# Patient Record
Sex: Female | Born: 1940 | Race: White | Hispanic: No | Marital: Married | Smoking: Former smoker
Health system: Southern US, Community
[De-identification: ages and names within clinical notes are randomized; demographics above are authoritative.]

## PROBLEM LIST (undated history)

## (undated) DIAGNOSIS — J449 Chronic obstructive pulmonary disease, unspecified: Secondary | ICD-10-CM

## (undated) DIAGNOSIS — T4145XA Adverse effect of unspecified anesthetic, initial encounter: Secondary | ICD-10-CM

## (undated) DIAGNOSIS — F419 Anxiety disorder, unspecified: Secondary | ICD-10-CM

## (undated) DIAGNOSIS — M1990 Unspecified osteoarthritis, unspecified site: Secondary | ICD-10-CM

## (undated) DIAGNOSIS — T8859XA Other complications of anesthesia, initial encounter: Secondary | ICD-10-CM

## (undated) HISTORY — PX: HERNIA REPAIR: SHX51

## (undated) HISTORY — PX: JOINT REPLACEMENT: SHX530

---

## 2004-02-26 ENCOUNTER — Ambulatory Visit: Payer: Self-pay | Admitting: Family Medicine

## 2005-03-01 ENCOUNTER — Ambulatory Visit: Payer: Self-pay | Admitting: Family Medicine

## 2006-03-08 ENCOUNTER — Ambulatory Visit: Payer: Self-pay | Admitting: Family Medicine

## 2006-03-22 ENCOUNTER — Ambulatory Visit: Payer: Self-pay | Admitting: Gastroenterology

## 2007-03-15 ENCOUNTER — Ambulatory Visit: Payer: Self-pay | Admitting: Family Medicine

## 2008-03-18 ENCOUNTER — Ambulatory Visit: Payer: Self-pay | Admitting: Family Medicine

## 2009-04-17 ENCOUNTER — Ambulatory Visit: Payer: Self-pay | Admitting: Family Medicine

## 2010-04-21 ENCOUNTER — Ambulatory Visit: Payer: Self-pay | Admitting: Family Medicine

## 2011-07-06 ENCOUNTER — Ambulatory Visit: Payer: Self-pay | Admitting: Family Medicine

## 2012-07-24 ENCOUNTER — Ambulatory Visit: Payer: Self-pay | Admitting: Internal Medicine

## 2012-07-25 ENCOUNTER — Ambulatory Visit: Payer: Self-pay | Admitting: Internal Medicine

## 2013-07-14 DIAGNOSIS — F419 Anxiety disorder, unspecified: Secondary | ICD-10-CM | POA: Insufficient documentation

## 2013-07-26 ENCOUNTER — Ambulatory Visit: Payer: Self-pay | Admitting: Internal Medicine

## 2014-02-13 ENCOUNTER — Observation Stay: Payer: Self-pay | Admitting: Surgery

## 2014-02-13 LAB — CBC WITH DIFFERENTIAL/PLATELET
BASOS ABS: 0 10*3/uL (ref 0.0–0.1)
Basophil %: 0.4 %
Eosinophil #: 0 10*3/uL (ref 0.0–0.7)
Eosinophil %: 0 %
HCT: 43.5 % (ref 35.0–47.0)
HGB: 14 g/dL (ref 12.0–16.0)
LYMPHS ABS: 1 10*3/uL (ref 1.0–3.6)
Lymphocyte %: 8.6 %
MCH: 29 pg (ref 26.0–34.0)
MCHC: 32.3 g/dL (ref 32.0–36.0)
MCV: 90 fL (ref 80–100)
MONOS PCT: 7.3 %
Monocyte #: 0.9 x10 3/mm (ref 0.2–0.9)
NEUTROS ABS: 9.7 10*3/uL — AB (ref 1.4–6.5)
NEUTROS PCT: 83.7 %
Platelet: 281 10*3/uL (ref 150–440)
RBC: 4.85 10*6/uL (ref 3.80–5.20)
RDW: 13.6 % (ref 11.5–14.5)
WBC: 11.6 10*3/uL — ABNORMAL HIGH (ref 3.6–11.0)

## 2014-02-13 LAB — CK TOTAL AND CKMB (NOT AT ARMC)
CK, Total: 87 U/L
CK-MB: 2.3 ng/mL (ref 0.5–3.6)

## 2014-02-13 LAB — COMPREHENSIVE METABOLIC PANEL
ALT: 29 U/L
Albumin: 4.3 g/dL (ref 3.4–5.0)
Alkaline Phosphatase: 72 U/L
Anion Gap: 6 — ABNORMAL LOW (ref 7–16)
BUN: 14 mg/dL (ref 7–18)
Bilirubin,Total: 0.9 mg/dL (ref 0.2–1.0)
CALCIUM: 9.7 mg/dL (ref 8.5–10.1)
CREATININE: 0.77 mg/dL (ref 0.60–1.30)
Chloride: 99 mmol/L (ref 98–107)
Co2: 31 mmol/L (ref 21–32)
Glucose: 116 mg/dL — ABNORMAL HIGH (ref 65–99)
OSMOLALITY: 273 (ref 275–301)
Potassium: 3.6 mmol/L (ref 3.5–5.1)
SGOT(AST): 22 U/L (ref 15–37)
SODIUM: 136 mmol/L (ref 136–145)
Total Protein: 7.9 g/dL (ref 6.4–8.2)

## 2014-02-13 LAB — LIPASE, BLOOD: Lipase: 114 U/L (ref 73–393)

## 2014-02-13 LAB — URINALYSIS, COMPLETE
BLOOD: NEGATIVE
Bacteria: NONE SEEN
Bilirubin,UR: NEGATIVE
Glucose,UR: NEGATIVE mg/dL (ref 0–75)
Leukocyte Esterase: NEGATIVE
NITRITE: NEGATIVE
Ph: 6 (ref 4.5–8.0)
Protein: NEGATIVE
RBC,UR: 5 /HPF (ref 0–5)
Specific Gravity: 1.018 (ref 1.003–1.030)
WBC UR: 1 /HPF (ref 0–5)

## 2014-02-13 LAB — TROPONIN I

## 2014-07-27 NOTE — H&P (Signed)
History of Present Illness 74 yof who had a RIH repair at age 74 or 74, who has noticed a groin buldge off and on for the last three months. Then yesterday she acutely developed lower abdominal pain, nausea, anorexia, and clear vomiting. No fever. Last meal yesterday. No sigificant pain in groin, but buldge doesn't resolve.   Past Med/Surgical Hx:  anxiety:   hernia repair:   arthritis:   ALLERGIES:  Amoxicillin: Insomnia  HOME MEDICATIONS: Medication Instructions Status  meloxicam 7.5 mg oral tablet 1 tab(s) orally once a day, As Needed Active  ALPRAZolam 0.25 mg oral tablet 1 tab(s) orally once a day, As Needed Active  acetaminophen 500 mg oral tablet 2 tab(s) orally every 6 hours, As Needed Active  multivitamin 1 tab(s) orally once a day Active  calcium carbonate 1 tab(s) orally once a day Active   Family and Social History:  Family History Non-Contributory   Social History positive tobacco (Greater than 1 year), negative ETOH, married, retired from [employer] and exercise [employer], smoked 1 1/2 PPD until 2008, no ETOH.   Place of Living Home   Review of Systems:  Fever/Chills No   Cough No   Sputum No   Abdominal Pain Yes   Diarrhea No   Constipation Yes   Nausea/Vomiting Yes   SOB/DOE No   Chest Pain No   Dysuria No   Tolerating PT Yes   Tolerating Diet No  Nauseated  Vomiting   Physical Exam:  GEN well developed, well nourished, no acute distress, thin   HEENT pink conjunctivae, PERRL, hearing intact to voice, moist oral mucosa, Oropharynx clear   NECK supple   RESP normal resp effort  clear BS  no use of accessory muscles   CARD regular rate  no murmur  No LE edema  no JVD  no Rub   ABD denies tenderness  soft   GU irreducible incarcerated RIH   EXTR negative cyanosis/clubbing, negative edema   SKIN normal to palpation, skin turgor poor   NEURO cranial nerves intact, negative tremor, follows commands, motor/sensory function intact   PSYCH alert,  A+O to time, place, person, good insight   Lab Results: Hepatic:  11-Nov-15 08:32   Bilirubin, Total 0.9  Alkaline Phosphatase 72 (46-116 NOTE: New Reference Range 10/23/13)  SGPT (ALT) 29 (14-63 NOTE: New Reference Range 10/23/13)  SGOT (AST) 22  Total Protein, Serum 7.9  Albumin, Serum 4.3  Routine Chem:  11-Nov-15 08:32   Glucose, Serum  116  BUN 14  Creatinine (comp) 0.77  Sodium, Serum 136  Potassium, Serum 3.6  Chloride, Serum 99  CO2, Serum 31  Calcium (Total), Serum 9.7  Osmolality (calc) 273  Anion Gap  6 (Result(s) reported on 13 Feb 2014 at 09:21AM.)  Lipase 114 (Result(s) reported on 13 Feb 2014 at 09:37AM.)  Cardiac:  11-Nov-15 08:32   Troponin I < 0.02 (0.00-0.05 0.05 ng/mL or less: NEGATIVE  Repeat testing in 3-6 hrs  if clinically indicated. >0.05 ng/mL: POTENTIAL  MYOCARDIAL INJURY. Repeat  testing in 3-6 hrs if  clinically indicated. NOTE: An increase or decrease  of 30% or more on serial  testing suggests a  clinically important change)  CK, Total 87 (26-192 NOTE: NEW REFERENCE RANGE  05/07/2013)  CPK-MB, Serum 2.3 (Result(s) reported on 13 Feb 2014 at 11:31AM.)  Routine UA:  11-Nov-15 08:32   Color (UA) Yellow  Clarity (UA) Clear  Glucose (UA) Negative  Bilirubin (UA) Negative  Ketones (UA) 1+  Specific Gravity (  UA) 1.018  Blood (UA) Negative  pH (UA) 6.0  Protein (UA) Negative  Nitrite (UA) Negative  Leukocyte Esterase (UA) Negative (Result(s) reported on 13 Feb 2014 at 09:24AM.)  RBC (UA) 5 /HPF  WBC (UA) 1 /HPF  Bacteria (UA) NONE SEEN  Epithelial Cells (UA) 2 /HPF (Result(s) reported on 13 Feb 2014 at 09:24AM.)  Routine Hem:  11-Nov-15 08:32   WBC (CBC)  11.6  RBC (CBC) 4.85  Hemoglobin (CBC) 14.0  Hematocrit (CBC) 43.5  Platelet Count (CBC) 281  MCV 90  MCH 29.0  MCHC 32.3  RDW 13.6  Neutrophil % 83.7  Lymphocyte % 8.6  Monocyte % 7.3  Eosinophil % 0.0  Basophil % 0.4  Neutrophil #  9.7  Lymphocyte # 1.0   Monocyte # 0.9  Eosinophil # 0.0  Basophil # 0.0 (Result(s) reported on 13 Feb 2014 at 09:21AM.)   Radiology Results: LabUnknown:    11-Nov-15 11:26, CT Abdomen and Pelvis With Contrast  PACS Image  CT:  CT Abdomen and Pelvis With Contrast  REASON FOR EXAM:    (1) vomiting, abd pain; (2) abd pain  COMMENTS:       PROCEDURE: CT  - CT ABDOMEN / PELVIS  W  - Feb 13 2014 11:26AM     CLINICAL DATA:  Abdominal pain. Vomiting. Hernia repair in  childhood. Bloating. Pain is primarily in the lower abdomen.    EXAM:  CT ABDOMEN AND PELVIS WITH CONTRAST    TECHNIQUE:  Multidetector CT imaging of the abdomen and pelvis was performed  using the standard protocol following bolus administration of  intravenous contrast.  CONTRAST:  100 cc Isovue 300    COMPARISON:  None.    FINDINGS:  Lower chest: Small hiatal hernia. Gastroesophageal reflux observed.    Hepatobiliary: Unremarkable    Pancreas: Borderline prominence the dorsal pancreatic duct in the  pancreatic body and head.    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    Adrenals/Urinary Tract: Unremarkable  Stomach/Bowel: Small hiatal hernia as noted above. Abnormal dilated  (3.2 cm in diameter) small bowel extending to a right inguinal  hernia at which there is atransition point from dilated afferent to  nondilated efferent bowel. Sigmoid colon diverticulosis noted  without active diverticulitis. Trace free pelvic fluid. Appendix  unremarkable.    Vascular/Lymphatic: Aortoiliac atherosclerotic vascular disease.    Reproductive: Mild prominence of left parametrial vascular  structures.    Other: No supplemental non-categorized findings.    Musculoskeletal: Right inguinal hernia causing small bowel  obstruction, with a knuckle of small bowel extending intothe right  inguinal hernia. The hernia extends lateral to the inferior  epigastric vessels and accordingly is compatible with an  indirect  inguinal hernia.    Extensive degenerative arthropathy of both hips with prominent  spurring, full-thickness loss of articular cartilage, and severe  subcortical cyst formation. Lumbar spondylosis and degenerative disc  disease with levoconvex lumbar scoliosis. Suspected right foraminal  impingement at L2-3 and left foraminal impingement at L5-S1. Grade 1  anterolisthesis at L3-4. Loss of disc height most striking at L2-3,  L4-5, and L5-S1.     IMPRESSION:  1. The dominant finding is a small bowel obstruction due to a right  indirect inguinal hernia. A knuckle of small bowel extends into the  right inguinal hernia which represents the transition point from  dilated to nondilated small bowel.  2. Small hiatal hernia with evidence of gastroesophageal reflux.  3. Markedly severe osteoarthritis of both hips.  4. Lower lumbar spondylosis and degenerative disc disease causing  impingement on the right at L2-3 and on the left at L5-S1.      Electronically Signed    By: Herbie Baltimore M.D.    On: 02/13/2014 11:40         Verified By: Dellia Cloud, M.D.,    Assessment/Admission Diagnosis Incarcerated recurrent RIH, with resultant SBO   Plan Redo RIH repair. Pt and husband understand risk of strangulation and potential need for enterectomy, and they agree.   Electronic Signatures: Claude Manges (MD)  (Signed 11-Nov-15 13:40)  Authored: CHIEF COMPLAINT and HISTORY, PAST MEDICAL/SURGIAL HISTORY, ALLERGIES, HOME MEDICATIONS, FAMILY AND SOCIAL HISTORY, REVIEW OF SYSTEMS, PHYSICAL EXAM, LABS, Radiology, ASSESSMENT AND PLAN   Last Updated: 11-Nov-15 13:40 by Claude Manges (MD)

## 2014-07-27 NOTE — Op Note (Signed)
PATIENT NAME:  Monica [name], Monica [name] MR#:  [id number] DATE OF BIRTH:  Sep 09, 1940  DATE OF PROCEDURE:  02/13/2014  PREOPERATIVE DIAGNOSIS: Incarcerated right inguinal hernia.   POSTOPERATIVE DIAGNOSIS: Incarcerated right femoral hernia.   PROCEDURE PERFORMED: Repair of right femoral hernia repair with preperitoneal Kugel patch.   SURGEON: Natale LayMark Mcclain Shall, MD FACS  ANESTHESIA: General endotracheal and local.   FINDINGS: The bowel reduced itself upon induction of general anesthesia. There was a femoral hernia with an approximately 1.5 cm neck.  SPECIMENS: Hernia sac to pathology.   ESTIMATED BLOOD LOSS: Minimal.   DESCRIPTION OF PROCEDURE: With informed consent, supine position, sterile prep and drape of the abdomen and right groin, timeout was observed.   An incision was fashioned in the right groin 1 fingerbreadth above the inguinal ligament with the scalpel and carried down through electrocautery through Scarpa fascia. There was no obvious external ring. The external oblique aponeurosis was then opened along its fibers with scissors. There was no obvious conjoined tendon. There was scar along the inguinal ligament, likely from previous hernia repair as a child. The preperitoneal space was opened by opening the floor of the inguinal canal with electrocautery. The pre-peritoneal space was thus opened and cooper's ligament exposed.  The femoral hernia sac was identified. In order for it to be reduced, the fibers medial to the hernia defect were incised with electrocautery for approximately 5 mm. The hernia sac was able to be dissected back out of the canal. It was opened. No small bowel was encountered in the sac. I brought up the loop of small bowel which appeared viable that had been in the hernia sac.  The hernia sac was ligated and submitted as specimen. This was accomplished with a running locking 000 silk suture.  A small oval Kugel patch was brought onto the field, secured to the shelving edge of the  Cooper ligament at 2 spots with #0 Vicryl suture and then unfurled onto the preperitoneal space covering all 3 potential hernia defects. Laterally and superiorly, it was sutured to the conjoined tendon. The transversalis fascia was then reapproximated with a running #0 Vicryl suture. The external oblique aponeurosis was then reapproximated in a similar fashion. 2-0 Vicryl and Scarpa fascia was accomplished. Skin staples were applied. The field block was created with a total of 30 mL of 0.25% plain Marcaine. Sterile dressing was applied, and the patient was subsequently taken to the recovery room, extubated in stable and satisfactory condition by anesthesia services.     ____________________________ Redge GainerMark A. Egbert GaribaldiBird, MD FACS mab:TT D: 02/13/2014 22:28:18 ET T: 02/13/2014 22:43:04 ET JOB#: [id number]  cc: Loraine LericheMark A. Egbert GaribaldiBird, MD, <Dictator> Raynald KempMARK A Jasmane Brockway MD ELECTRONICALLY SIGNED 02/13/2014 23:31

## 2014-07-29 LAB — SURGICAL PATHOLOGY

## 2014-08-19 ENCOUNTER — Other Ambulatory Visit: Payer: Self-pay | Admitting: Internal Medicine

## 2014-08-19 DIAGNOSIS — Z1231 Encounter for screening mammogram for malignant neoplasm of breast: Secondary | ICD-10-CM

## 2014-08-29 ENCOUNTER — Ambulatory Visit: Payer: Self-pay

## 2014-09-25 ENCOUNTER — Encounter
Admission: RE | Admit: 2014-09-25 | Discharge: 2014-09-25 | Disposition: A | Discharge status: Home / Self Care | Payer: Medicare Other | Source: Ambulatory Visit | Attending: Orthopedic Surgery | Admitting: Orthopedic Surgery

## 2014-09-25 DIAGNOSIS — J449 Chronic obstructive pulmonary disease, unspecified: Secondary | ICD-10-CM | POA: Insufficient documentation

## 2014-09-25 DIAGNOSIS — Z884 Allergy status to anesthetic agent status: Secondary | ICD-10-CM | POA: Diagnosis not present

## 2014-09-25 DIAGNOSIS — Z791 Long term (current) use of non-steroidal anti-inflammatories (NSAID): Secondary | ICD-10-CM | POA: Diagnosis not present

## 2014-09-25 DIAGNOSIS — Z01812 Encounter for preprocedural laboratory examination: Secondary | ICD-10-CM | POA: Diagnosis present

## 2014-09-25 DIAGNOSIS — Z01810 Encounter for preprocedural cardiovascular examination: Secondary | ICD-10-CM | POA: Diagnosis present

## 2014-09-25 DIAGNOSIS — M1990 Unspecified osteoarthritis, unspecified site: Secondary | ICD-10-CM | POA: Insufficient documentation

## 2014-09-25 DIAGNOSIS — Z5181 Encounter for therapeutic drug level monitoring: Secondary | ICD-10-CM | POA: Diagnosis not present

## 2014-09-25 DIAGNOSIS — Z881 Allergy status to other antibiotic agents status: Secondary | ICD-10-CM | POA: Diagnosis not present

## 2014-09-25 DIAGNOSIS — F419 Anxiety disorder, unspecified: Secondary | ICD-10-CM | POA: Diagnosis not present

## 2014-09-25 DIAGNOSIS — Z79899 Other long term (current) drug therapy: Secondary | ICD-10-CM | POA: Diagnosis not present

## 2014-09-25 DIAGNOSIS — K219 Gastro-esophageal reflux disease without esophagitis: Secondary | ICD-10-CM | POA: Diagnosis not present

## 2014-09-25 HISTORY — DX: Other complications of anesthesia, initial encounter: T88.59XA

## 2014-09-25 HISTORY — DX: Adverse effect of unspecified anesthetic, initial encounter: T41.45XA

## 2014-09-25 HISTORY — DX: Anxiety disorder, unspecified: F41.9

## 2014-09-25 HISTORY — DX: Unspecified osteoarthritis, unspecified site: M19.90

## 2014-09-25 LAB — BASIC METABOLIC PANEL
Anion gap: 13 (ref 5–15)
BUN: 10 mg/dL (ref 6–20)
CALCIUM: 9.9 mg/dL (ref 8.9–10.3)
CO2: 28 mmol/L (ref 22–32)
CREATININE: 0.49 mg/dL (ref 0.44–1.00)
Chloride: 103 mmol/L (ref 101–111)
GFR calc Af Amer: >60 mL/min (ref >60)
GFR calc non Af Amer: >60 mL/min (ref >60)
Glucose, Bld: 102 mg/dL — ABNORMAL HIGH (ref 65–99)
Potassium: 3.8 mmol/L (ref 3.5–5.1)
SODIUM: 144 mmol/L (ref 135–145)

## 2014-09-25 LAB — URINALYSIS COMPLETE WITH MICROSCOPIC (ARMC ONLY)
Bilirubin Urine: NEGATIVE
Glucose, UA: NEGATIVE mg/dL
Hgb urine dipstick: NEGATIVE
KETONES UR: NEGATIVE mg/dL
Leukocytes, UA: NEGATIVE
NITRITE: NEGATIVE
Protein, ur: NEGATIVE mg/dL
Specific Gravity, Urine: 1.008 (ref 1.005–1.030)
pH: 7 (ref 5.0–8.0)

## 2014-09-25 LAB — PROTIME-INR
INR: 0.96
Prothrombin Time: 13 seconds (ref 11.4–15.0)

## 2014-09-25 LAB — CBC
HCT: 40 % (ref 35.0–47.0)
Hemoglobin: 13.1 g/dL (ref 12.0–16.0)
MCH: 28.8 pg (ref 26.0–34.0)
MCHC: 32.8 g/dL (ref 32.0–36.0)
MCV: 87.6 fL (ref 80.0–100.0)
PLATELETS: 215 10*3/uL (ref 150–440)
RBC: 4.57 MIL/uL (ref 3.80–5.20)
RDW: 13.4 % (ref 11.5–14.5)
WBC: 8.5 10*3/uL (ref 3.6–11.0)

## 2014-09-25 LAB — TYPE AND SCREEN
ABO/RH(D): O NEG
Antibody Screen: NEGATIVE

## 2014-09-25 LAB — SURGICAL PCR SCREEN
MRSA, PCR: NEGATIVE
Staphylococcus aureus: NEGATIVE

## 2014-09-25 LAB — APTT: APTT: 27 s (ref 24–36)

## 2014-09-25 LAB — ABO/RH: ABO/RH(D): O NEG

## 2014-09-25 LAB — SEDIMENTATION RATE: Sed Rate: 5 mm/hr (ref 0–30)

## 2014-09-25 NOTE — Patient Instructions (Signed)
  Your procedure is scheduled on: Wednesday 7/6 Report to Day Surgery.  MEDICAL MALL ENTRANCE To find out your arrival time please call [phone] between 1PM - 3PM on Tuesday 7/5.  Remember: Instructions that are not followed completely may result in serious medical risk, up to and including death, or upon the discretion of your surgeon and anesthesiologist your surgery may need to be rescheduled.    __X__ 1. Do not eat food or drink liquids after midnight. No gum chewing or hard candies.     __X__ 2. No Alcohol for 24 hours before or after surgery.   ____ 3. Bring all medications with you on the day of surgery if instructed.    __X__ 4. Notify your doctor if there is any change in your medical condition     (cold, fever, infections).     Do not wear jewelry, make-up, hairpins, clips or nail polish.  Do not wear lotions, powders, or perfumes.  Do not shave 48 hours prior to surgery. Men may shave face and neck.  Do not bring valuables to the hospital.    [facility] is not responsible for any belongings or valuables.               Contacts, dentures or bridgework may not be worn into surgery.  Leave your suitcase in the car. After surgery it may be brought to your room.  For patients admitted to the hospital, discharge time is determined by your                treatment team.   Patients discharged the day of surgery will not be allowed to drive home.   Please read over the following fact sheets that you were given:   MRSA Information and Surgical Site Infection Prevention   __X__ Take these medicines the morning of surgery with A SIP OF WATER:    1. ALPRAZOLAM IF NEEDED  2.   3.   4.  5.  6.  ____ Fleet Enema (as directed)   __X__ Use CHG Soap as directed  ____ Use inhalers on the day of surgery  ____ Stop metformin 2 days prior to surgery    ____ Take 1/2 of usual insulin dose the night before surgery and none on the morning of surgery.   ____ Stop  Coumadin/Plavix/aspirin on   ____ Stop Anti-inflammatories on    ____ Stop supplements until after surgery.    ____ Bring C-Pap to the hospital.

## 2014-09-26 LAB — URINE CULTURE

## 2014-10-09 ENCOUNTER — Encounter
Admission: RE | Disposition: A | Discharge status: Home Health | Payer: Self-pay | Source: Ambulatory Visit | Attending: Orthopedic Surgery

## 2014-10-09 ENCOUNTER — Encounter: Payer: Self-pay | Admitting: *Deleted

## 2014-10-09 ENCOUNTER — Inpatient Hospital Stay: Payer: Medicare Other | Admitting: Anesthesiology

## 2014-10-09 ENCOUNTER — Inpatient Hospital Stay
Admission: RE | Admit: 2014-10-09 | Discharge: 2014-10-12 | DRG: 470 | Disposition: A | Discharge status: Home Health | Payer: Medicare Other | Source: Ambulatory Visit | Attending: Orthopedic Surgery | Admitting: Orthopedic Surgery

## 2014-10-09 ENCOUNTER — Inpatient Hospital Stay: Payer: Medicare Other

## 2014-10-09 DIAGNOSIS — Z681 Body mass index (BMI) 19 or less, adult: Secondary | ICD-10-CM | POA: Diagnosis not present

## 2014-10-09 DIAGNOSIS — K219 Gastro-esophageal reflux disease without esophagitis: Secondary | ICD-10-CM | POA: Diagnosis present

## 2014-10-09 DIAGNOSIS — M1611 Unilateral primary osteoarthritis, right hip: Secondary | ICD-10-CM | POA: Diagnosis present

## 2014-10-09 DIAGNOSIS — M8568 Other cyst of bone, other site: Secondary | ICD-10-CM | POA: Diagnosis present

## 2014-10-09 DIAGNOSIS — M169 Osteoarthritis of hip, unspecified: Secondary | ICD-10-CM | POA: Diagnosis present

## 2014-10-09 DIAGNOSIS — F419 Anxiety disorder, unspecified: Secondary | ICD-10-CM | POA: Diagnosis present

## 2014-10-09 DIAGNOSIS — Z96641 Presence of right artificial hip joint: Secondary | ICD-10-CM

## 2014-10-09 DIAGNOSIS — Z96649 Presence of unspecified artificial hip joint: Secondary | ICD-10-CM

## 2014-10-09 HISTORY — PX: TOTAL HIP ARTHROPLASTY: SHX124

## 2014-10-09 LAB — TYPE AND SCREEN
ABO/RH(D): O NEG
Antibody Screen: NEGATIVE

## 2014-10-09 SURGERY — ARTHROPLASTY, HIP, TOTAL,POSTERIOR APPROACH
Anesthesia: Spinal | Laterality: Right | Wound class: Clean

## 2014-10-09 MED ORDER — ACETAMINOPHEN 10 MG/ML IV SOLN
INTRAVENOUS | Status: AC
  Filled 2014-10-09: qty 100

## 2014-10-09 MED ORDER — FAMOTIDINE 20 MG PO TABS
20.0000 mg | ORAL_TABLET | Freq: Once | ORAL | Status: AC
  Administered 2014-10-09: 20 mg via ORAL

## 2014-10-09 MED ORDER — ACETAMINOPHEN 10 MG/ML IV SOLN
1000.0000 mg | Freq: Four times a day (QID) | INTRAVENOUS | Status: AC
  Administered 2014-10-09 – 2014-10-10 (×4): 1000 mg via INTRAVENOUS
  Filled 2014-10-09 (×4): qty 100

## 2014-10-09 MED ORDER — TETRACAINE HCL 1 % IJ SOLN
INTRAMUSCULAR | Status: DC | PRN
  Administered 2014-10-09: 5 mg via INTRASPINAL

## 2014-10-09 MED ORDER — METOCLOPRAMIDE HCL 10 MG PO TABS
10.0000 mg | ORAL_TABLET | Freq: Three times a day (TID) | ORAL | Status: AC
  Administered 2014-10-09 – 2014-10-11 (×7): 10 mg via ORAL
  Filled 2014-10-09 (×7): qty 1

## 2014-10-09 MED ORDER — FLUMAZENIL 0.5 MG/5ML IV SOLN
INTRAVENOUS | Status: AC
Start: 2014-10-09 — End: 2014-10-09
  Filled 2014-10-09: qty 5

## 2014-10-09 MED ORDER — CLINDAMYCIN PHOSPHATE 900 MG/50ML IV SOLN
900.0000 mg | Freq: Once | INTRAVENOUS | Status: AC
  Administered 2014-10-09: 900 mg via INTRAVENOUS

## 2014-10-09 MED ORDER — ALPRAZOLAM 0.5 MG PO TABS
0.2500 mg | ORAL_TABLET | Freq: Every day | ORAL | Status: DC | PRN
  Administered 2014-10-09 – 2014-10-11 (×3): 0.25 mg via ORAL
  Filled 2014-10-09 (×3): qty 1

## 2014-10-09 MED ORDER — CALCIUM CARBONATE-VITAMIN D 500-200 MG-UNIT PO TABS
2.0000 | ORAL_TABLET | Freq: Every day | ORAL | Status: DC
  Administered 2014-10-10 – 2014-10-12 (×3): 2 via ORAL
  Filled 2014-10-09 (×3): qty 2

## 2014-10-09 MED ORDER — FENTANYL CITRATE (PF) 100 MCG/2ML IJ SOLN
INTRAMUSCULAR | Status: DC | PRN
  Administered 2014-10-09: 50 ug via INTRAVENOUS

## 2014-10-09 MED ORDER — ONDANSETRON HCL 4 MG/2ML IJ SOLN: 4.0000 mg | Freq: Four times a day (QID) | INTRAMUSCULAR | Status: DC | PRN

## 2014-10-09 MED ORDER — PHENYLEPHRINE HCL 10 MG/ML IJ SOLN
INTRAMUSCULAR | Status: DC | PRN
  Administered 2014-10-09 (×2): 100 ug via INTRAVENOUS
  Administered 2014-10-09 (×2): 50 ug via INTRAVENOUS

## 2014-10-09 MED ORDER — DIPHENHYDRAMINE HCL 12.5 MG/5ML PO ELIX: 12.5000 mg | ORAL_SOLUTION | ORAL | Status: DC | PRN

## 2014-10-09 MED ORDER — FERROUS SULFATE 325 (65 FE) MG PO TABS
325.0000 mg | ORAL_TABLET | Freq: Two times a day (BID) | ORAL | Status: DC
  Administered 2014-10-09 – 2014-10-12 (×6): 325 mg via ORAL
  Filled 2014-10-09 (×6): qty 1

## 2014-10-09 MED ORDER — MORPHINE SULFATE 2 MG/ML IJ SOLN
2.0000 mg | INTRAMUSCULAR | Status: DC | PRN
  Administered 2014-10-09 – 2014-10-10 (×3): 2 mg via INTRAVENOUS
  Filled 2014-10-09 (×3): qty 1

## 2014-10-09 MED ORDER — ENOXAPARIN SODIUM 30 MG/0.3ML ~~LOC~~ SOLN
30.0000 mg | Freq: Two times a day (BID) | SUBCUTANEOUS | Status: DC
  Administered 2014-10-10 – 2014-10-12 (×5): 30 mg via SUBCUTANEOUS
  Filled 2014-10-09 (×5): qty 0.3

## 2014-10-09 MED ORDER — TRANEXAMIC ACID 1000 MG/10ML IV SOLN
1000.0000 mg | INTRAVENOUS | Status: DC
  Filled 2014-10-09: qty 10

## 2014-10-09 MED ORDER — ALUM & MAG HYDROXIDE-SIMETH 200-200-20 MG/5ML PO SUSP: 30.0000 mL | ORAL | Status: DC | PRN

## 2014-10-09 MED ORDER — BISACODYL 10 MG RE SUPP: 10.0000 mg | Freq: Every day | RECTAL | Status: DC | PRN

## 2014-10-09 MED ORDER — ACETAMINOPHEN 650 MG RE SUPP: 650.0000 mg | Freq: Four times a day (QID) | RECTAL | Status: DC | PRN

## 2014-10-09 MED ORDER — GLYCOPYRROLATE 0.2 MG/ML IJ SOLN
INTRAMUSCULAR | Status: DC | PRN
  Administered 2014-10-09: 0.2 mg via INTRAVENOUS

## 2014-10-09 MED ORDER — PROPOFOL INFUSION 10 MG/ML OPTIME
INTRAVENOUS | Status: DC | PRN
  Administered 2014-10-09: 40 ug/kg/min via INTRAVENOUS

## 2014-10-09 MED ORDER — TETRACAINE HCL 1 % IJ SOLN
INTRAMUSCULAR | Status: AC
  Filled 2014-10-09: qty 2

## 2014-10-09 MED ORDER — BUPIVACAINE HCL (PF) 0.5 % IJ SOLN
INTRAMUSCULAR | Status: DC | PRN
  Administered 2014-10-09: 2.5 mL

## 2014-10-09 MED ORDER — TRANEXAMIC ACID 1000 MG/10ML IV SOLN
1000.0000 mg | Freq: Once | INTRAVENOUS | Status: AC
  Administered 2014-10-09: 1000 mg via INTRAVENOUS
  Filled 2014-10-09: qty 10

## 2014-10-09 MED ORDER — ADULT MULTIVITAMIN W/MINERALS CH
1.0000 | ORAL_TABLET | Freq: Every day | ORAL | Status: DC
  Administered 2014-10-10 – 2014-10-12 (×3): 1 via ORAL
  Filled 2014-10-09 (×3): qty 1

## 2014-10-09 MED ORDER — FLEET ENEMA 7-19 GM/118ML RE ENEM: 1.0000 | ENEMA | Freq: Once | RECTAL | Status: AC | PRN

## 2014-10-09 MED ORDER — FENTANYL CITRATE (PF) 100 MCG/2ML IJ SOLN: 25.0000 ug | INTRAMUSCULAR | Status: DC | PRN

## 2014-10-09 MED ORDER — SODIUM CHLORIDE 0.9 % IV SOLN
1000.0000 mg | INTRAVENOUS | Status: DC | PRN
  Administered 2014-10-09: 1000 mg via INTRAVENOUS

## 2014-10-09 MED ORDER — NEOMYCIN-POLYMYXIN B GU 40-200000 IR SOLN
Status: DC | PRN
  Administered 2014-10-09: 12 mL

## 2014-10-09 MED ORDER — ONDANSETRON HCL 4 MG/2ML IJ SOLN: 4.0000 mg | Freq: Once | INTRAMUSCULAR | Status: DC | PRN

## 2014-10-09 MED ORDER — SODIUM CHLORIDE 0.9 % IV SOLN
INTRAVENOUS | Status: DC
  Administered 2014-10-09: 13:00:00 via INTRAVENOUS

## 2014-10-09 MED ORDER — CLINDAMYCIN PHOSPHATE 600 MG/50ML IV SOLN
600.0000 mg | Freq: Four times a day (QID) | INTRAVENOUS | Status: AC
  Administered 2014-10-09 – 2014-10-10 (×4): 600 mg via INTRAVENOUS
  Filled 2014-10-09 (×4): qty 50

## 2014-10-09 MED ORDER — MENTHOL 3 MG MT LOZG: 1.0000 | LOZENGE | OROMUCOSAL | Status: DC | PRN

## 2014-10-09 MED ORDER — NEOMYCIN-POLYMYXIN B GU 40-200000 IR SOLN
Status: AC
  Filled 2014-10-09: qty 20

## 2014-10-09 MED ORDER — TRAMADOL HCL 50 MG PO TABS
50.0000 mg | ORAL_TABLET | ORAL | Status: DC | PRN
  Administered 2014-10-12: 100 mg via ORAL
  Filled 2014-10-09: qty 2

## 2014-10-09 MED ORDER — PHENYLEPHRINE HCL 10 MG/ML IJ SOLN
10000.0000 ug | INTRAMUSCULAR | Status: DC | PRN
  Administered 2014-10-09: 10 ug/min via INTRAVENOUS

## 2014-10-09 MED ORDER — PROPOFOL 10 MG/ML IV BOLUS
INTRAVENOUS | Status: DC | PRN
  Administered 2014-10-09: 40 mg via INTRAVENOUS

## 2014-10-09 MED ORDER — MAGNESIUM HYDROXIDE 400 MG/5ML PO SUSP
30.0000 mL | Freq: Every day | ORAL | Status: DC | PRN
  Administered 2014-10-11: 30 mL via ORAL
  Filled 2014-10-09: qty 30

## 2014-10-09 MED ORDER — SENNOSIDES-DOCUSATE SODIUM 8.6-50 MG PO TABS
1.0000 | ORAL_TABLET | Freq: Two times a day (BID) | ORAL | Status: DC
  Administered 2014-10-09 – 2014-10-12 (×6): 1 via ORAL
  Filled 2014-10-09 (×6): qty 1

## 2014-10-09 MED ORDER — ACETAMINOPHEN 325 MG PO TABS: 650.0000 mg | ORAL_TABLET | Freq: Four times a day (QID) | ORAL | Status: DC | PRN

## 2014-10-09 MED ORDER — MIDAZOLAM HCL 5 MG/5ML IJ SOLN
INTRAMUSCULAR | Status: DC | PRN
  Administered 2014-10-09: 1 mg via INTRAVENOUS

## 2014-10-09 MED ORDER — ONDANSETRON HCL 4 MG PO TABS: 4.0000 mg | ORAL_TABLET | Freq: Four times a day (QID) | ORAL | Status: DC | PRN

## 2014-10-09 MED ORDER — LACTATED RINGERS IV SOLN
INTRAVENOUS | Status: DC
  Administered 2014-10-09 (×2): via INTRAVENOUS

## 2014-10-09 MED ORDER — PHENOL 1.4 % MT LIQD: 1.0000 | OROMUCOSAL | Status: DC | PRN

## 2014-10-09 MED ORDER — OXYCODONE HCL 5 MG PO TABS
5.0000 mg | ORAL_TABLET | ORAL | Status: DC | PRN
  Administered 2014-10-09 (×2): 5 mg via ORAL
  Administered 2014-10-10 (×5): 10 mg via ORAL
  Administered 2014-10-11 (×2): 5 mg via ORAL
  Administered 2014-10-11: 10 mg via ORAL
  Administered 2014-10-11: 5 mg via ORAL
  Administered 2014-10-11: 10 mg via ORAL
  Filled 2014-10-09 (×2): qty 2
  Filled 2014-10-09: qty 1
  Filled 2014-10-09: qty 2
  Filled 2014-10-09 (×3): qty 1
  Filled 2014-10-09: qty 2
  Filled 2014-10-09: qty 1
  Filled 2014-10-09 (×2): qty 2
  Filled 2014-10-09: qty 1
  Filled 2014-10-09: qty 2

## 2014-10-09 MED ORDER — ACETAMINOPHEN 10 MG/ML IV SOLN
INTRAVENOUS | Status: DC | PRN
  Administered 2014-10-09: 1000 mg via INTRAVENOUS

## 2014-10-09 MED ORDER — PANTOPRAZOLE SODIUM 40 MG PO TBEC
40.0000 mg | DELAYED_RELEASE_TABLET | Freq: Two times a day (BID) | ORAL | Status: DC
  Administered 2014-10-09 – 2014-10-12 (×6): 40 mg via ORAL
  Filled 2014-10-09 (×6): qty 1

## 2014-10-09 SURGICAL SUPPLY — 53 items
BLADE DRUM FLTD (BLADE) ×3 IMPLANT
BLADE SAW 1 (BLADE) ×3 IMPLANT
CANISTER SUCT 1200ML W/VALVE (MISCELLANEOUS) ×3 IMPLANT
CANISTER SUCT 3000ML (MISCELLANEOUS) ×6 IMPLANT
CAPT HIP TOTAL 2 ×3 IMPLANT
CARTRIDGE OIL MAESTRO DRILL (MISCELLANEOUS) ×1 IMPLANT
CATH FOL LEG HOLDER (MISCELLANEOUS) ×3 IMPLANT
CATH TRAY 16F METER LATEX (MISCELLANEOUS) ×3 IMPLANT
CUP ACET PINNACLE SECTR 60MM (Hips) ×1 IMPLANT
DIFFUSER MAESTRO (MISCELLANEOUS) ×3 IMPLANT
DRAPE INCISE IOBAN 66X60 STRL (DRAPES) ×3 IMPLANT
DRAPE SHEET LG 3/4 BI-LAMINATE (DRAPES) ×3 IMPLANT
DRAPE TABLE BACK 80X90 (DRAPES) ×3 IMPLANT
DRSG DERMACEA 8X12 NADH (GAUZE/BANDAGES/DRESSINGS) ×3 IMPLANT
DRSG OPSITE POSTOP 4X12 (GAUZE/BANDAGES/DRESSINGS) IMPLANT
DRSG OPSITE POSTOP 4X14 (GAUZE/BANDAGES/DRESSINGS) ×3 IMPLANT
DRSG TEGADERM 4X4.75 (GAUZE/BANDAGES/DRESSINGS) ×3 IMPLANT
DURAPREP 26ML APPLICATOR (WOUND CARE) ×3 IMPLANT
ELECT BLADE 6.5 EXT (BLADE) ×3 IMPLANT
ELECT CAUTERY BLADE 6.4 (BLADE) ×3 IMPLANT
GLOVE BIO SURGEON STRL SZ7 (GLOVE) ×3 IMPLANT
GLOVE BIOGEL M STRL SZ7.5 (GLOVE) ×6 IMPLANT
GLOVE INDICATOR 8.0 STRL GRN (GLOVE) ×3 IMPLANT
GLOVE SURG 9.0 ORTHO LTXF (GLOVE) ×3 IMPLANT
GLOVE SURG ORTHO 9.0 STRL STRW (GLOVE) ×3 IMPLANT
GOWN STRL REUS W/ TWL LRG LVL3 (GOWN DISPOSABLE) ×1 IMPLANT
GOWN STRL REUS W/TWL 2XL LVL3 (GOWN DISPOSABLE) ×3 IMPLANT
GOWN STRL REUS W/TWL LRG LVL3 (GOWN DISPOSABLE) ×2
GOWN STRL REUS W/TWL XL LVL4 (GOWN DISPOSABLE) ×3 IMPLANT
HANDPIECE SUCTION TUBG SURGILV (MISCELLANEOUS) ×3 IMPLANT
HEMOVAC 400CC 10FR (MISCELLANEOUS) ×3 IMPLANT
HOOD PEEL AWAY FACE SHEILD DIS (HOOD) ×6 IMPLANT
KIT RM TURNOVER STRD PROC AR (KITS) ×3 IMPLANT
NDL SAFETY 18GX1.5 (NEEDLE) ×3 IMPLANT
NS IRRIG 1000ML POUR BTL (IV SOLUTION) ×3 IMPLANT
OIL CARTRIDGE MAESTRO DRILL (MISCELLANEOUS) ×3
PACK HIP PROSTHESIS (MISCELLANEOUS) ×3 IMPLANT
PINNSECTOR W/GRIP ACE CUP 60MM (Hips) ×3 IMPLANT
SOL .9 NS 3000ML IRR  AL (IV SOLUTION) ×2
SOL .9 NS 3000ML IRR UROMATIC (IV SOLUTION) ×1 IMPLANT
SOL PREP PVP 2OZ (MISCELLANEOUS) ×3
SOLUTION PREP PVP 2OZ (MISCELLANEOUS) ×1 IMPLANT
SPONGE DRAIN TRACH 4X4 STRL 2S (GAUZE/BANDAGES/DRESSINGS) ×9 IMPLANT
STAPLER SKIN PROX 35W (STAPLE) ×3 IMPLANT
SUT ETHIBOND #5 BRAIDED 30INL (SUTURE) ×3 IMPLANT
SUT VIC AB 0 CT1 36 (SUTURE) ×3 IMPLANT
SUT VIC AB 1 CT1 36 (SUTURE) ×6 IMPLANT
SUT VIC AB 2-0 CT1 27 (SUTURE) ×2
SUT VIC AB 2-0 CT1 TAPERPNT 27 (SUTURE) ×1 IMPLANT
SYR 20CC LL (SYRINGE) ×3 IMPLANT
TAPE ADH 3 LX (MISCELLANEOUS) ×3 IMPLANT
TAPE TRANSPORE STRL 2 31045 (GAUZE/BANDAGES/DRESSINGS) ×3 IMPLANT
WATER STERILE IRR 1000ML POUR (IV SOLUTION) ×6 IMPLANT

## 2014-10-09 NOTE — Anesthesia Preprocedure Evaluation (Signed)
Anesthesia Evaluation  Patient identified by MRN, date of birth, ID bandGeneral Assessment Comment:Woke up agitated during hernia surgery  Reviewed: Allergy & Precautions, NPO status , Patient's Chart, lab work & pertinent test results  History of Anesthesia Complications (+) history of anesthetic complications  Airway Mallampati: II  TM Distance: >3 FB Neck ROM: Limited    Dental no notable dental hx.    Pulmonary former smoker,  breath sounds clear to auscultation  Pulmonary exam normal       Cardiovascular + Orthopnea negative cardio ROS Normal cardiovascular exam    Neuro/Psych Anxiety negative neurological ROS     GI/Hepatic Neg liver ROS, GERD-  Medicated and Controlled,  Endo/Other  negative endocrine ROS  Renal/GU negative Renal ROS  negative genitourinary   Musculoskeletal  (+) Arthritis -, Osteoarthritis,    Abdominal Normal abdominal exam  (+)   Peds negative pediatric ROS (+)  Hematology negative hematology ROS (+)   Anesthesia Other Findings   Reproductive/Obstetrics                             Anesthesia Physical Anesthesia Plan  ASA: II  Anesthesia Plan: Spinal   Post-op Pain Management:    Induction: Intravenous  Airway Management Planned: Nasal Cannula  Additional Equipment:   Intra-op Plan:   Post-operative Plan:   Informed Consent: I have reviewed the patients History and Physical, chart, labs and discussed the procedure including the risks, benefits and alternatives for the proposed anesthesia with the patient or authorized representative who has indicated his/her understanding and acceptance.   Dental advisory given  Plan Discussed with: CRNA and Surgeon  Anesthesia Plan Comments:         Anesthesia Quick Evaluation

## 2014-10-09 NOTE — Progress Notes (Signed)
Patient came to floor with temperature that was not within normal parameters.  Bear Hugger remained until patient resumed normal temperature.  Patient still does not have feeling in legs or feet.  Patient very anxious about the feeling in her legs.  Reassured patient that sensation will begin to come back and patient instructed to advise when she starts to feel discomfort.

## 2014-10-09 NOTE — Op Note (Signed)
DATE OF SURGERY:  10/09/2014  PATIENT NAME:  Monica [name]   DOB: December 17, 1940  MRN: [id number]  PRE-OPERATIVE DIAGNOSIS: Degenerative arthrosis of the right hip, primary  POST-OPERATIVE DIAGNOSIS:  Same  PROCEDURE:  Right total hip arthroplasty, autologous bone grafting of acetabular cysts  SURGEON:  Jena Gauss. M.D.  ASSISTANT:  Van Clines, PA (present and scrubbed throughout the case, critical for assistance with exposure, retraction, instrumentation, and closure)  ANESTHESIA: spinal  ESTIMATED BLOOD LOSS: 300 mL  FLUIDS REPLACED: 1200 mL of crystalloid  DRAINS: 2 medium drains to a Hemovac reservoir  IMPLANTS UTILIZED: DePuy 13.5 mm small stature AML femoral stem, 60 mm OD Pinnacle Gription Sector acetabular component, neutral Pinnacle Marathon polyethylene insert, and a 36 mm M-SPEC +1.5 mm hip ball, 2 - 6.45mm cancellous screws  INDICATIONS FOR SURGERY: Monica [name] is a 74 y.o. year old female with a long history of progressive hip and groin  pain. X-rays demonstrated severe degenerative changes. The patient had not seen any significant improvement despite conservative nonsurgical intervention. After discussion of the risks and benefits of surgical intervention, the patient expressed understanding of the risks benefits and agree with plans for total hip arthroplasty.   The risks, benefits, and alternatives were discussed at length including but not limited to the risks of infection, bleeding, nerve injury, stiffness, blood clots, the need for revision surgery, limb length inequality, dislocation, cardiopulmonary complications, among others, and they were willing to proceed.  PROCEDURE IN DETAIL: The patient was brought into the operating room and, after adequate spinal anesthesia was achieved, the patient was placed in a left lateral decubitus position. Axillary roll was placed and all bony prominences were well-padded. The patient's right hip was cleaned and prepped with alcohol  and DuraPrep and draped in the usual sterile fashion. A "timeout" was performed as per usual protocol. A lateral curvilinear incision was made gently curving towards the posterior superior iliac spine. The IT band was incised in line with the skin incision and the fibers of the gluteus maximus were split in line. The piriformis tendon was identified, skeletonized, and incised at its insertion to the proximal femur and reflected posteriorly. A T type posterior capsulotomy was performed. Prior to dislocation of the femoral head, a threaded Steinmann pin was inserted through a separate stab incision into the pelvis superior to the acetabulum and bent in the form of a stylus so as to assess limb length and hip offset throughout the procedure. The femoral head was then dislocated posteriorly. Inspection of the femoral head demonstrated severe degenerative changes with full-thickness loss of articular cartilage. The femoral neck cut was performed using an oscillating saw. The anterior capsule was elevated off of the femoral neck using a periosteal elevator. Attention was then directed to the acetabulum. The remnant of the labrum was excised using electrocautery. Inspection of the acetabulum also demonstrated significant degenerative changes with an irregular wear pattern and cystic lesions. The acetabulum was reamed in sequential fashion up to a 59 mm diameter. Good punctate bleeding bone was encountered. Cancellous bone graft was otained from the femoral head using a 43 mm acetabular reamer. The cystic lesions were packed with the bone graft and impacted by using the last reamer in reverse. A 60 mm Pinnacle Gription Sector acetabular component was positioned and impacted into place. Additional fixation was obtained by placing two2 6.5 mm cancellous screws through the dome holes. A neutral polyethylene trial was inserted.  Attention was then directed to the proximal femur. A hole  for reaming of the proximal femoral  canal was created using a high-speed burr. The femoral canal was reamed in sequential fashion up to a 13 mm diameter. This allowed for approximately 6 mm of scratch fit. Serial broaches were inserted up to a 13.5 mm small stature femoral broach. Calcar region was planed and a trial reduction was performed using a 36 mm hip ball with a +1.5 mm neck length. Good equalization of limb lengths and hip offset was appreciated and excellent stability was noted both anteriorly and posteriorly. Trial components were removed. The acetabular shell was irrigated with copious amounts of normal saline with antibiotic solution and suctioned dry. A neutral Pinnacle Marathon polyethylene insert was positioned and impacted into place. Next, a 13.5 mm small stature AML femoral stem was positioned and impacted into place. Excellent scratch fit was appreciated. A trial reduction was again performed with a 36 mm hip ball with a +1.5 mm neck length. Again, good equalization of limb lengths was appreciated and excellent stability appreciated both anteriorly and posteriorly. The hip was then dislocated and the trial hip ball was removed. The Morse taper was cleaned and dried. A 36 mm M-SPEC hip ball with a +1.5 mm neck length was placed on the trunnion and impacted into place. The hip was then reduced and placed through range of motion. Excellent stability was appreciated both anteriorly and posteriorly.  The wound was irrigated with copious amounts of normal saline with antibiotic solution and suctioned dry. Good hemostasis was appreciated. The posterior capsulotomy was repaired using #5 Ethibond. Piriformis tendon was reapproximated to the undersurface of the gluteus medius tendon using #5 Ethibond. Two medium drains were placed in the wound bed and brought out through separate stab incisions to be attached to a Hemovac reservoir. The IT band was reapproximated using interrupted sutures of #1 Vicryl. Subcutaneous tissue was proximal  phalanx using first #0 Vicryl followed by #2-0 Vicryl. The skin was closed with skin staples.  The patient tolerated the procedure well and was transported to the recovery room in stable condition.   Jena GaussJames P Hooten, Jr., M.D.

## 2014-10-09 NOTE — Anesthesia Procedure Notes (Signed)
Spinal  Start time: 10/09/2014 7:14 AM End time: 10/09/2014 7:25 AM Staffing Resident/CRNA: Omer JackWEATHERLY, Monica Romano Performed by: resident/CRNA  Preanesthetic Checklist Completed: patient identified, site marked, surgical consent, pre-op evaluation, timeout performed, IV checked, risks and benefits discussed and monitors and equipment checked Spinal Block Patient position: sitting Prep: Betadine Patient monitoring: heart rate, continuous pulse ox and blood pressure Approach: midline Location: L2-3 Needle Needle type: Quincke  Needle gauge: 25 G Needle length: 9 cm Assessment Sensory level: T4

## 2014-10-09 NOTE — Plan of Care (Signed)
Problem: Consults Goal: Diagnosis- Total Joint Replacement Primary Total Hip     

## 2014-10-09 NOTE — Progress Notes (Signed)
Patient arrived from PACU hypothermic with temp at 94.8 tympanic.  Placed on bear hugger and temperature in room increased.  Will closely monitor for improvement.

## 2014-10-09 NOTE — H&P (Signed)
The patient has been re-examined, and the chart reviewed, and there have been no interval changes to the documented history and physical.    The risks, benefits, and alternatives have been discussed at length, and the patient is willing to proceed.   

## 2014-10-09 NOTE — Brief Op Note (Signed)
10/09/2014  10:40 AM  PATIENT:  Monica [name]  74 y.o. female  PRE-OPERATIVE DIAGNOSIS:  DEGENERATIVE OSTEOARTHRITIS, Right hip   POST-OPERATIVE DIAGNOSIS:  DEGENERATIVE OSTEOARTHRITIS, Right hip   PROCEDURE:  Procedure(s): TOTAL HIP ARTHROPLASTY (Right)  SURGEON:  Surgeon(s) and Role:    * Donato HeinzJames P Carmelia Tiner, MD - Primary  ASSISTANTS: Van ClinesJon Wolfe, PA   ANESTHESIA:   spinal  EBL:  Total I/O In: 1050 [I.V.:1050] Out: 950 [Urine:650; Blood:300]  BLOOD ADMINISTERED:none  DRAINS: 2 medium hemovac   LOCAL MEDICATIONS USED:  NONE  SPECIMEN:  Source of Specimen:  right femoral head  DISPOSITION OF SPECIMEN:  PATHOLOGY  COUNTS:  YES  TOURNIQUET:  * No tourniquets in log *  DICTATION: .Dragon Dictation  PLAN OF CARE: Admit to inpatient   PATIENT DISPOSITION:  PACU - hemodynamically stable.   Delay start of Pharmacological VTE agent (>24hrs) due to surgical blood loss or risk of bleeding: yes

## 2014-10-09 NOTE — Transfer of Care (Signed)
Immediate Anesthesia Transfer of Care Note  Patient: Lonna DuvalJoan Kneisel  Procedure(s) Performed: Procedure(s): TOTAL HIP ARTHROPLASTY (Right)  Patient Location: PACU  Anesthesia Type:Spinal  Level of Consciousness: awake, alert  and oriented  Airway & Oxygen Therapy: Patient Spontanous Breathing and Patient connected to nasal cannula oxygen  Post-op Assessment: Report given to RN and Post -op Vital signs reviewed and stable  Post vital signs: Reviewed and stable  Last Vitals:  Filed Vitals:   10/09/14 1042  BP: 90/69  Pulse: 72  Temp: 35.5 C  Resp: 17    Complications: No apparent anesthesia complications

## 2014-10-09 NOTE — Progress Notes (Signed)
[facility]AL DRSG SENT TO OR WITH PATIENT TO OR WITH THERMAL CAP IN PLACE

## 2014-10-09 NOTE — Care Management Note (Addendum)
Case Management Note  Patient Details  Name: Kareemah Grounds MRN: [id number] Date of Birth: 12/18/1940  Subjective/Objective:                  Met with patient and her husband Sonia Side to discuss discharge planning. Patient just received from PACU. She plans to return home at discharge. She states she has a rolling walker available to use at home. She uses [facility] [phone] for Rx.She would like to use [facility] and for outpatient PT she would like to see Eden Emms DPT [phone].   Action/Plan: Referral made to [facility]. RNCM to monitor PT. RNCM to check cost and availability of Lovenox $RemoveBe'40mg'tzXxdSVlJ$  #14.Lovenox called in 10/10/14 by this RNCM.  Expected Discharge Date:                  Expected Discharge Plan:     In-House Referral:     Discharge planning Services  CM Consult  Post Acute Care Choice:    Choice offered to:  Patient, Spouse  DME Arranged:    DME Agency:     HH Arranged:  PT HH Agency:  [facility]  Status of Service:     Medicare Important Message Given:    Date Medicare IM Given:    Medicare IM give by:    Date Additional Medicare IM Given:    Additional Medicare Important Message give by:     If discussed at [place] of Stay Meetings, dates discussed:    Additional Comments: [facility] and [facility] stated two Lovenox were called in: one order for $RemoveB'30mg'IgAnQUNI$  #14 and the other $RemoveB'40mg'jCjJevpt$  #14. I cancelled $RemoveBef'30mg'rgQkVzmSlX$  #14 order. Cost $6.   Marshell Garfinkel, RN 10/09/2014, 1:48 PM

## 2014-10-10 LAB — CBC
HEMATOCRIT: 29.3 % — AB (ref 35.0–47.0)
Hemoglobin: 9.7 g/dL — ABNORMAL LOW (ref 12.0–16.0)
MCH: 29.3 pg (ref 26.0–34.0)
MCHC: 33.2 g/dL (ref 32.0–36.0)
MCV: 88.2 fL (ref 80.0–100.0)
Platelets: 165 10*3/uL (ref 150–440)
RBC: 3.32 MIL/uL — ABNORMAL LOW (ref 3.80–5.20)
RDW: 13.6 % (ref 11.5–14.5)
WBC: 7.8 10*3/uL (ref 3.6–11.0)

## 2014-10-10 LAB — BASIC METABOLIC PANEL
ANION GAP: 7 (ref 5–15)
BUN: 9 mg/dL (ref 6–20)
CO2: 25 mmol/L (ref 22–32)
CREATININE: 0.42 mg/dL — AB (ref 0.44–1.00)
Calcium: 8.4 mg/dL — ABNORMAL LOW (ref 8.9–10.3)
Chloride: 106 mmol/L (ref 101–111)
GFR calc Af Amer: >60 mL/min (ref >60)
GFR calc non Af Amer: >60 mL/min (ref >60)
Glucose, Bld: 109 mg/dL — ABNORMAL HIGH (ref 65–99)
POTASSIUM: 3.4 mmol/L — AB (ref 3.5–5.1)
Sodium: 138 mmol/L (ref 135–145)

## 2014-10-10 MED ORDER — TRAMADOL HCL 50 MG PO TABS: 50.0000 mg | ORAL_TABLET | ORAL | Status: DC | PRN

## 2014-10-10 MED ORDER — POTASSIUM CHLORIDE 20 MEQ PO PACK
30.0000 meq | PACK | Freq: Three times a day (TID) | ORAL | Status: AC
  Administered 2014-10-10 (×3): 30 meq via ORAL
  Filled 2014-10-10 (×3): qty 2

## 2014-10-10 MED ORDER — ENOXAPARIN SODIUM 30 MG/0.3ML ~~LOC~~ SOLN: 30.0000 mg | SUBCUTANEOUS | Status: DC

## 2014-10-10 MED ORDER — OXYCODONE HCL 5 MG PO TABS: 5.0000 mg | ORAL_TABLET | ORAL | Status: DC | PRN

## 2014-10-10 MED ORDER — ENSURE ENLIVE PO LIQD
237.0000 mL | Freq: Every day | ORAL | Status: DC
  Administered 2014-10-10 – 2014-10-11 (×2): 237 mL via ORAL

## 2014-10-10 NOTE — Evaluation (Signed)
Occupational Therapy Evaluation Patient Details Name: Shemika Robbs MRN: [id number] DOB: 1940-09-25 Today's Date: 10/10/2014    History of Present Illness This patient is a 74 year old female who came to [facility] for a R THR (posterior approach)   Clinical Impression   This patient is a 74 year old female who came to [facility] for a R total hip replacement (posterior approach).  Patient lives in a one story home with 1 step with out rail and 3 steps with rail  to enter.  She had been independent with ADL and functional mobility with recent walker use.  She  now requires modereate assistance for lower body dressing using hip kit while staying within hip precautions (posterior approach).      Follow Up Recommendations       Equipment Recommendations    Hip kit, bedside commode    Recommendations for Other Services       Precautions / Restrictions Precautions Precautions: Posterior Hip;Fall Precaution Booklet Issued: Yes (comment) (PT) Precaution Comments: Hip abduction pillow Restrictions Weight Bearing Restrictions: Yes RLE Weight Bearing: Weight bearing as tolerated      Mobility          Transfers          Balance                                  ADL                                         General ADL Comments: Had been indpendent with recent walker use now needs assit for lower body dressing. Practiced donning and doffing socks and pants to knees using hip kit with moderate assistance and verbal cues.     Vision     Perception     Praxis      Pertinent Vitals/Pain Pain Assessment: 0-10 Pain Score: 6  Pain Location: R hip Pain Descriptors / Indicators: Sharp;Shooting;Tender Pain Intervention(s): Limited activity within patient's tolerance;Monitored during session;Premedicated before session;Repositioned     Hand Dominance     Extremity/Trunk Assessment Upper Extremity Assessment Upper Extremity  Assessment: Overall WFL for tasks assessed         Communication Communication Communication: No difficulties   Cognition Arousal/Alertness: Awake/alert Behavior During Therapy: Anxious Overall Cognitive Status: Within Functional Limits for tasks assessed       Memory: Decreased recall of precautions (Patient did not know precautions but husband did )             General Comments       Exercises       Shoulder Instructions      Home Living Family/patient expects to be discharged to:: Private residence Living Arrangements: Spouse/significant other Available Help at Discharge: Family Type of Home: House Home Access: Stairs to enter [employer] of Steps: 1 from front (no railing) or 3 with R rail from garage   Home Layout: One level               Home Equipment: Environmental consultant - 2 wheels;Cane - single point          Prior Functioning/Environment Level of Independence: Independent with assistive device(s)        Comments: Pt using RW for last month but had been using SPC prior to that  OT Diagnosis: Acute pain   OT Problem List:     OT Treatment/Interventions: Self-care/ADL training    OT Goals(Current goals can be found in the care plan section) Acute Rehab OT Goals Patient Stated Goal: Wants to go home at discharge OT Goal Formulation: With patient/family Time For Goal Achievement: 10/24/14 Potential to Achieve Goals: Good  OT Frequency: Min 1X/week   Barriers to D/C:            Co-evaluation              End of Session Equipment Utilized During Treatment:  (Hip kit)  Activity Tolerance:   Patient left: in chair;with call bell/phone within reach;with chair alarm set;with family/visitor present   Time: 1035-1055 OT Time Calculation (min): 20 min Charges:  OT General Charges $OT Visit: 1 Procedure OT Evaluation $Initial OT Evaluation Tier I: 1 Procedure OT Treatments $Self Care/Home Management : 8-22 mins G-Codes:     Myrene Galas, MS/OTR/L  10/10/2014, 11:07 AM

## 2014-10-10 NOTE — Discharge Instructions (Signed)
° °POSTERIOR TOTAL HIP REPLACEMENT POSTOPERATIVE DIRECTIONS ° °Hip Rehabilitation, Guidelines Following Surgery  °The results of a hip operation are greatly improved after range of motion and muscle strengthening exercises. Follow all safety measures which are given to protect your hip. If any of these exercises cause increased pain or swelling in your joint, decrease the amount until you are comfortable again. Then slowly increase the exercises. Call your caregiver if you have problems or questions.  ° °HOME CARE INSTRUCTIONS  °Remove items at home which could result in a fall. This includes throw rugs or furniture in walking pathways.  °· ICE to the affected hip every three hours for 30 minutes at a time and then as needed for pain and swelling.  Continue to use ice on the hip for pain and swelling from surgery. You may notice swelling that will progress down to the foot and ankle.  This is normal after surgery.  Elevate the leg when you are not up walking on it.   °· Continue to use the breathing machine which will help keep your temperature down.  It is common for your temperature to cycle up and down following surgery, especially at night when you are not up moving around and exerting yourself.  The breathing machine keeps your lungs expanded and your temperature down. ° °DIET °You may resume your previous home diet once your are discharged from the hospital. ° °DRESSING / WOUND  CARE / SHOWERING ° °DO NOT GET THE INCISION WET °You may start showering once staples have been removed at 2 weeks. Change dressing as needed. Do not submerge the incision in water such as a bath tub, swimming pool or hot tubs until the incision is completely healed which is approximately 4 weeks. ° ° °ACTIVITY °Walk with your walker as instructed. °Use walker as long as suggested by your caregivers.May go to using a cane once your therapist feels that it is safe to do so. °Avoid periods of inactivity such as sitting longer than an  hour when not asleep. This helps prevent blood clots.  °You may resume a sexual relationship in one month or when given the OK by your doctor.  °You may return to work once you are cleared by your doctor.  °Do not drive a car for 6 weeks or until released by you surgeon.  °Do not drive while taking narcotics. ° ° °WEIGHT BEARING °You may wait bear as tolerated on the surgical leg. ° °POSTOPERATIVE CONSTIPATION PROTOCOL °Constipation - defined medically as fewer than three stools per week and severe constipation as less than one stool per week. ° °One of the most common issues patients have following surgery is constipation.  Even if you have a regular bowel pattern at home, your normal regimen is likely to be disrupted due to multiple reasons following surgery.  Combination of anesthesia, postoperative narcotics, change in appetite and fluid intake all can affect your bowels.  In order to avoid complications following surgery, here are some recommendations in order to help you during your recovery period. ° °Colace (docusate) - Pick up an over-the-counter form of Colace or another stool softener and take twice a day as long as you are requiring postoperative pain medications.  Take with a full glass of water daily.  If you experience loose stools or diarrhea, hold the colace until you stool forms back up.  If your symptoms do not get better within 1 week or if they get worse, check with your doctor. ° °Dulcolax (  bisacodyl) - Pick up over-the-counter and take as directed by the product packaging as needed to assist with the movement of your bowels.  Take with a full glass of water.  Use this product as needed if not relieved by Colace only.  ° °MiraLax (polyethylene glycol) - Pick up over-the-counter to have on hand.  MiraLax is a solution that will increase the amount of water in your bowels to assist with bowel movements.  Take as directed and can mix with a glass of water, juice, soda, coffee, or tea.  Take if you  go more than two days without a movement. °Do not use MiraLax more than once per day. Call your doctor if you are still constipated or irregular after using this medication for 7 days in a row. ° °If you continue to have problems with postoperative constipation, please contact the office for further assistance and recommendations.  If you experience "the worst abdominal pain ever" or develop nausea or vomiting, please contact the office immediatly for further recommendations for treatment. ° °ITCHING ° If you experience itching with your medications, try taking only a single pain pill, or even half a pain pill at a time.  You can also use Benadryl over the counter for itching or also to help with sleep.  ° °TED HOSE STOCKINGS °Wear the elastic stockings on both legs. If you go home, you may remove these at night but will need to put them on the first thing in the morning. If you go to rehab, then you may remove them one hour per 8 hour shift. This is because in rehab you are not as active as you are at home. ° °MEDICATIONS °See your medication summary on the “After Visit Summary” that the nursing staff will review with you prior to discharge.  You may have some home medications which will be placed on hold until you complete the course of blood thinner medication.  It is important for you to complete the blood thinner medication as prescribed by your surgeon.  Continue your approved medications as instructed at time of discharge. ° °PRECAUTIONS °If you experience chest pain or shortness of breath - call 911 immediately for transfer to the hospital emergency department.  °If you develop a fever greater that 101 F, purulent drainage from wound, increased redness or drainage from wound, foul odor from the wound/dressing, or calf pain - CONTACT YOUR SURGEON.   °                                                °FOLLOW-UP APPOINTMENTS °Make sure you keep all of your appointments after your operation with your surgeon and  caregivers. You should call the office at the above phone number and make an appointment for approximately 6 weeks after the date of your surgery or on the date instructed by your surgeon outlined in the "After Visit Summary". This appointment should have already been made prior to the surgery. If you don't remember the date and time, call the office at [phone]. ° °RANGE OF MOTION AND STRENGTHENING EXERCISES  °These exercises are designed to help you keep full movement of your hip joint. Follow your caregiver's or physical therapist's instructions. Perform all exercises about fifteen times, three times per day or as directed. Exercise both hips, even if you have had only one joint replacement. These exercises   can be done on a training (exercise) mat, on the floor, on a table or on a bed. Use whatever works the best and is most comfortable for you. Use music or television while you are exercising so that the exercises are a pleasant break in your day. This will make your life better with the exercises acting as a break in routine you can look forward to.  °Lying on your back, slowly slide your foot toward your buttocks, raising your knee up off the floor. Then slowly slide your foot back down until your leg is straight again.  °Lying on your back spread your legs as far apart as you can without causing discomfort.  °Lying on your side, raise your upper leg and foot straight up from the floor as far as is comfortable. Slowly lower the leg and repeat.  °Lying on your back, tighten up the muscle in the front of your thigh (quadriceps muscles). You can do this by keeping your leg straight and trying to raise your heel off the floor. This helps strengthen the largest muscle supporting your knee.  °Lying on your back, tighten up the muscles of your buttocks both with the legs straight and with the knee bent at a comfortable angle while keeping your heel on the floor.  ° °DON'T FORGET THE 4 POSTERIOR HIP  PRECAUTIONS ° ° °IF YOU ARE TRANSFERRED TO A SKILLED REHAB FACILITY °If the patient is transferred to a skilled rehab facility following release from the hospital, a list of the current medications will be sent to the facility for the patient to continue.  When discharged from the skilled rehab facility, please have the facility set up the patient's Home Health Physical Therapy prior to being released. Also, the skilled facility will be responsible for providing the patient with their medications at time of release from the facility to include their pain medication, the muscle relaxants, and their blood thinner medication. If the patient is still at the rehab facility at time of the two week follow up appointment, the skilled rehab facility will also need to assist the patient in arranging follow up appointment in our office and any transportation needs. ° °MAKE SURE YOU:  °Understand these instructions.  °Get help right away if you are not doing well or get worse.  ° ° °Pick up stool softner and laxative for home use following surgery while on pain medications. °Continue to use ice for pain and swelling after surgery. °Do not use any lotions or creams on the incision until instructed by your surgeon. ° °

## 2014-10-10 NOTE — Anesthesia Postprocedure Evaluation (Signed)
  Anesthesia Post-op Note  Patient: Monica [name]  Procedure(s) Performed: Procedure(s): TOTAL HIP ARTHROPLASTY (Right)  Anesthesia type:Spinal  Patient location: PACU  Post pain: Pain level controlled  Post assessment: Post-op Vital signs reviewed, Patient's Cardiovascular Status Stable, Respiratory Function Stable, Patent Airway and No signs of Nausea or vomiting  Post vital signs: Reviewed and stable  Last Vitals:  Filed Vitals:   10/10/14 0439  BP: 102/60  Pulse: 81  Temp: 36.8 C  Resp: 18    Level of consciousness: awake, alert  and patient cooperative  Complications: No apparent anesthesia complications

## 2014-10-10 NOTE — Progress Notes (Signed)
Initial Nutrition Assessment  INTERVENTION:  Meals and Snacks: Cater to patient preferences Medical Food Supplement Therapy: will recommend sending Ensure 1-2 times a day for added nutrition; Ensure Enlive (each supplement provides 350kcal and 20 grams of protein)  NUTRITION DIAGNOSIS:  Inadequate oral intake related to acute illness as evidenced by meal completion < 25%.  GOAL:  Patient will meet greater than or equal to 90% of their needs  MONITOR:   (Energy Intake, Electrolyte and renal Profile, Anthropometrics)  REASON FOR ASSESSMENT:  Malnutrition Screening Tool    ASSESSMENT:  Pt admitted with degenerative osteoarthritis POD1 right hip arthroplasty. PMHx:  Past Medical History  Diagnosis Date  . Arthritis   . Complication of anesthesia     woke up agitated following hernia surgery 02/2014  . Anxiety   . GERD (gastroesophageal reflux disease)     Diet Order:  Diet regular Room service appropriate?: Yes; Fluid consistency:: Thin  Current Nutrition: Pt reports not eating but bites of breakfast this am secondary to timing. Pt reports eating well last night Mal[place]urkey and mashed potatoes, 75% of dinner recorded per I/O chart.  Food/Nutrition-Related History: Pt reports being very picky in food preferences. Pt reports eating what she likes only, no vegetables. Pt reports having Boost PTA usually 1 a day at lunch, not tolerating as a snack between meals.    Medications: NS at 17900mL/hr, MVI, KCl, Protonix, Reglan, Ferrous Sulfate, Calcium-vitamin D  Electrolyte/Renal Profile and Glucose Profile:   Recent Labs Lab 10/10/14 0530  NA 138  K 3.4*  CL 106  CO2 25  BUN 9  CREATININE 0.42*  CALCIUM 8.4*  GLUCOSE 109*   Protein Profile: No results for input(s): ALBUMIN in the last 168 hours.  Gastrointestinal Profile: Last BM: 7/6   Nutrition-Focused Physical Exam Findings:  Unable to complete Nutrition-Focused physical exam at this time.    Weight Change: Pt  reports weight of 130-134lbs last November (2015), (10% weight loss in 10 months). Anthropometrics:   Height:  Ht Readings from Last 1 Encounters:  10/09/14 5\' 5"  (1.651 m)    Weight:  Wt Readings from Last 1 Encounters:  10/09/14 117 lb (53.071 kg)    Wt Readings from Last 10 Encounters:  10/09/14 117 lb (53.071 kg)  09/25/14 117 lb (53.071 kg)    BMI:  Body mass index is 19.47 kg/(m^2).  Skin:  Reviewed, no issues  EDUCATION NEEDS:  No education needs identified at this time   LOW Care Level  Leda QuailAllyson Raijon Lindfors, RD, LDN Pager [phone]

## 2014-10-10 NOTE — Progress Notes (Signed)
   Subjective: 1 Day Post-Op Procedure(s) (LRB): TOTAL HIP ARTHROPLASTY (Right) Patient reports pain as 2 on 0-10 scale.   Patient is well, and has had no acute complaints or problems We will start therapy today.  Plan is to go Home after hospital stay. no nausea and no vomiting Patient denies any chest pains or shortness of breath. Objective: Vital signs in last 24 hours: Temp:  [94.8 F (34.9 C)-98.7 F (37.1 C)] 98.2 F (36.8 C) (07/07 0439) Pulse Rate:  [49-106] 81 (07/07 0439) Resp:  [13-48] 18 (07/07 0439) BP: (63-147)/(44-128) 102/60 mmHg (07/07 0439) SpO2:  [94 %-100 %] 94 % (07/07 0439) well approximated incision Heels are non tender and elevated off the bed using rolled towels Intake/Output from previous day: 07/06 0701 - 07/07 0700 In: 1810 [P.O.:360; I.V.:1450] Out: 2740 [Urine:2150; Drains:290; Blood:300] Intake/Output this shift:     Recent Labs  10/10/14 0530  HGB 9.7*    Recent Labs  10/10/14 0530  WBC 7.8  RBC 3.32*  HCT 29.3*  PLT 165    Recent Labs  10/10/14 0530  NA 138  K 3.4*  CL 106  CO2 25  BUN 9  CREATININE 0.42*  GLUCOSE 109*  CALCIUM 8.4*   No results for input(s): LABPT, INR in the last 72 hours.  EXAM General - Patient is Alert, Appropriate and Oriented Extremity - Neurologically intact Neurovascular intact Sensation intact distally Intact pulses distally Dorsiflexion/Plantar flexion intact Dressing - dressing C/D/I Motor Function - intact, moving foot and toes well on exam. Able to move the left leg but having difficulty moving the right leg. Patient states that it feels like he weighs a ton.  Past Medical History  Diagnosis Date  . Arthritis   . Complication of anesthesia     woke up agitated following hernia surgery 02/2014  . Anxiety   . GERD (gastroesophageal reflux disease)     Assessment/Plan: 1 Day Post-Op Procedure(s) (LRB): TOTAL HIP ARTHROPLASTY (Right) Principal Problem:   Degenerative joint  disease (DJD) of hip Active Problems:   S/P total hip arthroplasty  Estimated body mass index is 19.47 kg/(m^2) as calculated from the following:   Height as of this encounter: 5\' 5"  (1.651 m).   Weight as of this encounter: 53.071 kg (117 lb). Advance diet Up with therapy D/C IV fluids Discharge home with home health  Labs: Were reviewed. DVT Prophylaxis - Lovenox, Foot Pumps and TED hose Weight-Bearing as tolerated to right leg We'll add Klor-Con Plan to discharge on Saturday morning after surgery Repeat labs tomorrow morning D/C O2 and Pulse OX and try on Room [employer]  Chantale Leugers [facility] PA [facility] 10/10/2014, 7:28 AM

## 2014-10-10 NOTE — Evaluation (Signed)
Physical Therapy Evaluation Patient Details Name: Monica [name] MRN: [id number] DOB: 1940-09-04 Today's Date: 10/10/2014   History of Present Illness  Pt is a 74 y.o. 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   Clinical Impression  Currently pt demonstrates impairments with strength, activity tolerance, and limitations with functional mobility.  Prior to admission, pt was independent using RW (prior to last month, pt was using SPC).  Pt lives with her husband in 1 level home with steps to enter.  Currently pt is mod assist supine to sit and min assist with transfers and taking a few steps with the RW.  Pt requiring significant increased time with session activities d/t increased anxiety in general.  Pt would benefit from skilled PT to address above noted impairments and functional limitations.  Recommend pt discharge to home with HHPT and assist of husband when medically appropriate.     Follow Up Recommendations Home health PT;Supervision/Assistance - 24 hour (pt's husband able to provide this per pt)    Equipment Recommendations   (Pt's husband reports he is going to pick up 3in1 for pt to use at home)    Recommendations for Other Services       Precautions / Restrictions Precautions Precautions: Fall;Posterior Hip Precaution Booklet Issued: Yes (comment) Precaution Comments: Hip abduction pillow Restrictions Weight Bearing Restrictions: Yes RLE Weight Bearing: Weight bearing as tolerated      Mobility  Bed Mobility Overal bed mobility: Needs Assistance Bed Mobility: Supine to Sit     Supine to sit: Mod assist;HOB elevated     General bed mobility comments: assist for R LE (and maintaining R post THP's); assist to scoot forward onto edge of bed; vc's required for hand and feet placement to assist with getting OOB  Transfers Overall transfer level: Needs assistance Equipment used: Rolling walker (2 wheeled) Transfers: Sit to/from  [employer] Sit to Stand: Min assist Stand pivot transfers: Min assist (bed to commode)       General transfer comment: pt required vc's for hand and feet placement and also to maintain R posterior THP's  Ambulation/Gait Ambulation/Gait assistance: Min assist Ambulation Distance (Feet): 3 Feet Assistive device: Rolling walker (2 wheeled)       General Gait Details: antalgic; decreased stance time R LE; vc's required for walker advancement and stepping pattern as well as increasing UE support through [employer] Mobility    Modified Rankin (Stroke Patients Only)       Balance Overall balance assessment: Needs assistance Sitting-balance support: Bilateral upper extremity supported;Feet supported Sitting balance-Leahy Scale: Fair     Standing balance support: Bilateral upper extremity supported Standing balance-Leahy Scale: Good                               Pertinent Vitals/Pain Pain Assessment: 0-10 Pain Score: 6  (0/10 end of session at rest) Pain Location: R hip Pain Descriptors / Indicators: Sharp;Shooting;Tender Pain Intervention(s): Limited activity within patient's tolerance;Monitored during session;Premedicated before session;Repositioned  After activity, pt's HR 66 bpm and O2 92% on room air.    Home Living Family/patient expects to be discharged to:: Private residence Living Arrangements: Spouse/significant other Available Help at Discharge: Family;Available 24 hours/day Type of Home: House Home Access: Stairs to enter   [employer] of Steps: 1 from front (no railing) or 3 with R rail  from garage Home Layout: One level Home Equipment: Dan HumphreysWalker - 2 wheels;Cane - single point      Prior Function Level of Independence: Independent with assistive device(s)         Comments: Pt using RW for last month but had been using SPC prior to that     Hand Dominance        Extremity/Trunk  Assessment   Upper Extremity Assessment: Overall WFL for tasks assessed           Lower Extremity Assessment: RLE deficits/detail;LLE deficits/detail RLE Deficits / Details: R hip flexion at least 2+/5 (limited d/t R hip pain); R knee flexion/extension at least 3/5; R DF at least 4/5 LLE Defic[facility]     Communication   Communication: No difficulties  Cognition Arousal/Alertness: Awake/alert Behavior During Therapy: Anxious Overall Cognitive Status: Within Functional Limits for tasks assessed       Memory: Decreased recall of precautions              General Comments  Nursing cleared pt for participation in PT.  Pt agreeable to PT session but perseverating on needing to go to bathroom but not being able to on bed pan (pt on bed pan on arrival; pt's R LE noticed to be IR with hip and knee flexed so therapist assisted pt in fixing position immediately d/t posterior THP's; nursing notified).  Pt's husband present for mobility part of session.    Exercises   Performed semi-supine B LE therapeutic exercise x 10 reps:  Ankle pumps (AROM B LE's); quad sets x3 second holds (AROM B LE's); glute squeezes x3 second holds (AROM B); hip aDduction isometrics (pillow between pt's knees) x3 second holds; SAQ's (AROM R; AROM L); heelslides (AAROM R; AROM L), hip abd/adduction (AAROM R; AROM L).  Pt required vc's and tactile cues for correct technique with exercises.        Assessment/Plan    PT Assessment Patient needs continued PT services  PT Diagnosis Acute pain;Difficulty walking   PT Problem List Decreased strength;Decreased activity tolerance;Decreased balance;Decreased mobility;Decreased knowledge of precautions;Pain  PT Treatment Interventions DME instruction;Gait training;Stair training;Functional mobility training;Therapeutic activities;Therapeutic exercise;Balance training;Patient/family education   PT Goals (Current goals can be found in the Care Plan section) Acute  Rehab PT Goals Patient Stated Goal: To go home PT Goal Formulation: With patient Time For Goal Achievement: 10/24/14 Potential to Achieve Goals: Fair    Frequency BID   Barriers to discharge        Co-evaluation               End of Session Equipment Utilized During Treatment: Gait belt Activity Tolerance: Other (comment) (Limited d/t increased anxiety) Patient left: in chair;with call bell/phone within reach;with family/visitor present;with SCD's reapplied (unable to find chair alarm for pt; nursing notified and aware (nursing reported she was having staff look for chair alarm)); folded pillow placed between pt's knees. Nurse Communication: Mobility status         Time: 0906-1006 PT Time Calculation (min) (ACUTE ONLY): 60 min   Charges:   PT Evaluation $Initial PT Evaluation Tier I: 1 Procedure PT Treatments $Therapeutic Exercise: 8-22 mins $Therapeutic Activity: 8-22 mins   PT G CodesHendricks Limes:        Jeanann Balinski 10/10/2014, 10:51 AM Hendricks LimesEmily Yvonne Petite, PT [phone]

## 2014-10-10 NOTE — Progress Notes (Signed)
Clinical Social Worker (CSW) received SNF consult. PT is recommending home health. RN Case Manager aware of above. Please reconsult if future social work needs arise. CSW signing off.   Georgios Kina Morgan, LCSWA [phone] 

## 2014-10-10 NOTE — Progress Notes (Signed)
Physical Therapy Treatment Patient Details Name: Monica DuvalJoan Nicks MRN: [id number] DOB: October 27, 1940 Today's Date: 10/10/2014    History of Present Illness This patient is a 74 year old [facility]    PT Comments    Pt able to progress to ambulation 90 feet with RW CGA to min assist x1 this afternoon.  Pt requires cueing for R posterior THP's with activity but pt's husband appears to understand THP's well and able to give pt cueing during session.  Increased time required for session activities d/t pt with increased anxiety in general.  Anticipate with continued progress, pt will be able to discharge home with support of her husband and HHPT.  Plan for stairs training with pt's husband tomorrow.   Follow Up Recommendations  Home health PT;Supervision/Assistance - 24 hour     Equipment Recommendations   (Pt's husband reported he would find a 3 in 1 commode)    Recommendations for Other Services       Precautions / Restrictions Precautions Precautions: Posterior Hip;Fall Precaution Booklet Issued: Yes (comment) Precaution Comments: Hip abduction pillow Restrictions Weight Bearing Restrictions: Yes RLE Weight Bearing: Weight bearing as tolerated    Mobility  Bed Mobility Overal bed mobility: Needs Assistance Bed Mobility: Sit to Supine       Sit to supine: Min assist   General bed mobility comments: assist for R LE (and maintaining R post THP's); vc's required for hand and feet placement to assist with getting into bed  Transfers Overall transfer level: Needs assistance Equipment used: Rolling walker (2 wheeled) Transfers: Sit to/from [employer];Stand Pivot Transfers Sit to Stand: Min guard;Min assist Stand pivot transfers: Min guard;Min assist       General transfer comment: pt required vc's for hand and feet placement and also to maintain R posterior THP's  Ambulation/Gait Ambulation/Gait assistance: Min guard;Min  assist Ambulation Distance (Feet): 90 Feet Assistive device: Rolling walker (2 wheeled)       General Gait Details: antalgic; decreased stance time R LE (pt NWB'ing at times and requiring vc's to increase WB'ing through R LE and UE's to normalize gait pattern); vc's required for walker advancement and stepping pattern   Stairs            Wheelchair Mobility    Modified Rankin (Stroke Patients Only)       Balance Overall balance assessment: Needs assistance Sitting-balance support: No upper extremity supported;Feet supported Sitting balance-Leahy Scale: Good     Standing balance support: Bilateral upper extremity supported Standing balance-Leahy Scale: Good                      Cognition Arousal/Alertness: Awake/alert Behavior During Therapy: Anxious Overall Cognitive Status: Within Functional Limits for tasks assessed       Memory: Decreased recall of precautions (Pt's husband able to recall THP's)              Exercises      General Comments  Nursing cleared pt for participation in physical therapy.  Pt agreeable to PT session.  Pt's husband present for entire session.  Reviewed hip precautions with pt and pt's husband (verbal and visual demonstration including turning during ambulation); requires review with pt; pt's husband appearing with good understanding.      Pertinent Vitals/Pain Pain Assessment: 0-10 Pain Score: 4  Pain Location: R hip Pain Descriptors / Indicators: Tender;Sore Pain Intervention(s): Limited activity within patient's tolerance;Monitored during session;Premedicated before session;Repositioned  After  activity HR 99 bpm and O2 95% on room air.    Home Living Family/patient expects to be discharged to:: Private residence Living Arrangements: Spouse/significant other Available Help at Discharge: Family Type of Home: House Home Access: Stairs to enter   Home Layout: One level Home Equipment: Environmental consultant - 2 wheels;Cane -  single point      Prior Function Level of Independence: Independent with assistive device(s)          PT Goals (current goals can now be found in the care plan section) Acute Rehab PT Goals Patient Stated Goal: Go home PT Goal Formulation: With patient Time For Goal Achievement: 10/24/14 Potential to Achieve Goals: Good Progress towards PT goals: Progressing toward goals    Frequency  BID    PT Plan Current plan remains appropriate    Co-evaluation             End of Session Equipment Utilized During Treatment: Gait belt Activity Tolerance: Other (comment) (Limited d/t increased anxiety) Patient left: in bed;with call bell/phone within reach;with bed alarm set;with family/visitor present;with SCD's reapplied (B heels elevated off bed via pillows and folded pillow placed between pt's knees)     Time: 4098-1191 PT Time Calculation (min) (ACUTE ONLY): 39 min  Charges:  $Gait Training: 8-22 mins $Therapeutic Exercise: 8-22 mins $Therapeutic Activity: 8-22 mins                    G CodesHendricks Limes 10-31-14, 2:49 PM Hendricks Limes, PT [phone]

## 2014-10-11 LAB — BASIC METABOLIC PANEL
Anion gap: 6 (ref 5–15)
BUN: 11 mg/dL (ref 6–20)
CHLORIDE: 106 mmol/L (ref 101–111)
CO2: 27 mmol/L (ref 22–32)
CREATININE: 0.38 mg/dL — AB (ref 0.44–1.00)
Calcium: 8.5 mg/dL — ABNORMAL LOW (ref 8.9–10.3)
GFR calc Af Amer: >60 mL/min (ref >60)
GFR calc non Af Amer: >60 mL/min (ref >60)
Glucose, Bld: 111 mg/dL — ABNORMAL HIGH (ref 65–99)
Potassium: 3.5 mmol/L (ref 3.5–5.1)
SODIUM: 139 mmol/L (ref 135–145)

## 2014-10-11 LAB — CBC
HEMATOCRIT: 29.9 % — AB (ref 35.0–47.0)
Hemoglobin: 10 g/dL — ABNORMAL LOW (ref 12.0–16.0)
MCH: 29.2 pg (ref 26.0–34.0)
MCHC: 33.3 g/dL (ref 32.0–36.0)
MCV: 87.6 fL (ref 80.0–100.0)
PLATELETS: 161 10*3/uL (ref 150–440)
RBC: 3.41 MIL/uL — ABNORMAL LOW (ref 3.80–5.20)
RDW: 13.8 % (ref 11.5–14.5)
WBC: 9.6 10*3/uL (ref 3.6–11.0)

## 2014-10-11 LAB — SURGICAL PATHOLOGY

## 2014-10-11 NOTE — Progress Notes (Signed)
Physical Therapy Treatment Patient Details Name: Monica [name] MRN: [id number] DOB: 10/21/1940 Today's Date: 10/11/2014    History of Present Illness This patient is a 74 year old female who came to [facility] for a R THR (posterior approach)    PT Comments    Pt progressing well with physical therapy in terms of functional mobility using RW but still requires vc's for R posterior THP's.  Pt requires increased time for therapy activities d/t increased anxiety in general.  Pt's husband appears to understand hip precautions well and able to give appropriate vc's during session.  Plan for stair training with pt's husband this afternoon.  Follow Up Recommendations  Home health PT;Supervision/Assistance - 24 hour     Equipment Recommendations       Recommendations for Other Services       Precautions / Restrictions Precautions Precautions: Posterior Hip;Fall Precaution Booklet Issued: Yes (comment) Precaution Comments: Hip abduction pillow Restrictions Weight Bearing Restrictions: Yes RLE Weight Bearing: Weight bearing as tolerated    Mobility  Bed Mobility Overal bed mobility: Needs Assistance Bed Mobility: Supine to Sit     Supine to sit: Min assist     General bed mobility comments: assist for R LE (and maintaining R post THP's)  Transfers Overall transfer level: Needs assistance Equipment used: Rolling walker (2 wheeled) Transfers: Sit to/from Stand Sit to Stand: Min guard         General transfer comment: pt required occasional vc's for hand and feet placement and also to maintain R posterior THP's  Ambulation/Gait Ambulation/Gait assistance: Supervision;Min guard Ambulation Distance (Feet): 170 Feet Assistive device: Rolling walker (2 wheeled)       General Gait Details: mildly antalgic; mild decreased stance time R LE (pt requiring occasional vc's to increase WB'ing through R LE and UE's to normalize gait pattern); vc's required for walker advancement and  stepping pattern occasionally   Stairs            Wheelchair Mobility    Modified Rankin (Stroke Patients Only)       Balance                                    Cognition Arousal/Alertness: Awake/alert Behavior During Therapy: Anxious Overall Cognitive Status: Within Functional Limits for tasks assessed       Memory: Decreased recall of precautions (Pt able to recall 1/3 precautions; pt's husband able to recall 3/3 precautions)              Exercises   Performed semi-supine B LE therapeutic exercise x 15 reps:  Ankle pumps (AROM B LE's); quad sets x3 second holds (AROM B LE's); glute squeezes x3 second holds (AROM B); hip aDduction isometrics (pillow between pt's knees) x3 second holds; SAQ's (AROM R; AROM L); heelslides (AAROM R; AROM L), hip abd/adduction (AAROM R; AAROM L).  Pt required vc's and tactile cues for correct technique with exercises.    General Comments  Nursing cleared pt for participation in PT.  Pt agreeable to PT session and pt's husband present for entire session.      Pertinent Vitals/Pain Pain Assessment: 0-10 Pain Score: 6  (0/10 at rest beginning of session) Pain Location: R hip Pain Descriptors / Indicators: Tightness;Sore Pain Intervention(s): Limited activity within patient's tolerance;Monitored during session;Repositioned;Patient requesting pain meds-RN notified  HR 107 bpm and O2 98% on room air at rest and HR 127bpm and O2 94% on  room air post ambulation.    Home Living                      Prior Function            PT Goals (current goals can now be found in the care plan section) Acute Rehab PT Goals Patient Stated Goal: Go home PT Goal Formulation: With patient Time For Goal Achievement: 10/24/14 Potential to Achieve Goals: Good Progress towards PT goals: Progressing toward goals    Frequency  BID    PT Plan Current plan remains appropriate    Co-evaluation             End of Session  Equipment Utilized During Treatment: Gait belt Activity Tolerance: Patient tolerated treatment well;Other (comment) (increased time required d/t anxiety in general) Patient left: in chair;with nursing/sitter in room;Other (comment) (NA present assisting pt washing up)     Time: 0921-1024 PT Time Calculation (min) (ACUTE ONLY): 63 min  Charges:  $Gait Training: 23-37 mins $Therapeutic Exercise: 23-37 mins                    G CodesHendricks Limes:      Eduar Kumpf 10/11/2014, 10:58 AM Hendricks LimesEmily Liyana Suniga, PT [phone]

## 2014-10-11 NOTE — Progress Notes (Signed)
Occupational Therapy Treatment Patient Details Name: Monica [name] MRN: [id number] DOB: 1940/07/16 Today's Date: 10/11/2014    History of present illness This patient is a 74 year old female who came to [facility] for a R THR (posterior approach)   OT comments  Patient named 2 of 3 hip precautions. She did well but is very anxious.   Follow Up Recommendations       Equipment Recommendations       Recommendations for Other Services      Precautions / Restrictions Precautions Precautions: Posterior Hip;Fall Precaution Booklet Issued: Yes (comment) Precaution Comments: Hip abduction pillow Restrictions Weight Bearing Restrictions: Yes RLE Weight Bearing: Weight bearing as tolerated       Mobility Bed Mobility  Transfers            Balance                                   ADL                                         General ADL Comments: Patient fully dressed upon arival. Practiced donning and doffing pants and socks using hip kit. She needed minimal assist and cues for proper technique.      Vision                     Perception     Praxis      Cognition   Behavior During Therapy: Anxious Overall Cognitive Status: Within Functional Limits for tasks assessed       Memory: Decreased recall of precautions (Patient named 2 of 3 hip precautions)               Extremity/Trunk Assessment               Exercises     Shoulder Instructions       General Comments      Pertinent Vitals/ Pain         Home Living                                          Prior Functioning/Environment              Frequency       Progress Toward Goals  OT Goals(current goals can now be found in the care plan section)  Progress towards OT goals: Progressing toward goals  Acute Rehab OT Goals Patient Stated Goal: Go home Potential to Achieve Goals: Good  Plan      Co-evaluation                  End of Session Equipment Utilized During Treatment:  (Hip kit)   Activity Tolerance Patient tolerated treatment well (but was anxious )   Patient Left     Nurse Communication          Time: 5366-4403 OT Time Calculation (min): 10 min  Charges: OT General Charges $OT Visit: 1 Procedure OT Treatments $Self Care/Home Management : 8-22 mins  Myrene Galas, MS/OTR/L   10/11/2014, 11:20 AM

## 2014-10-11 NOTE — Progress Notes (Signed)
Physical Therapy Treatment Patient Details Name: Monica DuvalJoan Cathey MRN: [id number] DOB: 1941/02/07 Today's Date: 10/11/2014    History of Present Illness This patient is a 74 year old female who [facility]R (posterior approach)    PT Comments    Pt progressing well with functional mobility using RW and was able to ambulate around the nurses station this afternoon and performed 3 stairs with R railing and CGA.  Pt requiring increased time for all session activities d/t increased anxiety in general.  Pt's husband present for entire session and was able to give appropriate vc's during session for technique as needed (for transfers, ambulation, stairs).  At end of session pt's husband was able to verbalize posterior hip precautions and proper techniques/safety required for safe transfers, ambulation, and stairs at home.  Pt's husband reports no concerns for discharge home (regarding assist required for pt) and plans to have 2nd person assist present for stairs (to safely enter pt's home).  Plan for pt to discharge home tomorrow with HHPT and assist of husband.  Follow Up Recommendations  Home health PT;Supervision/Assistance - 24 hour     Equipment Recommendations   (Pt's husband obtained a 3in1 commode per pt)    Recommendations for Other Services       Precautions / Restrictions Precautions Precautions: Posterior Hip;Fall Precaution Booklet Issued: Yes (comment) Precaution Comments: Hip abduction pillow Restrictions Weight Bearing Restrictions: Yes RLE Weight Bearing: Weight bearing as tolerated    Mobility  Bed Mobility Overal bed mobility: Needs Assistance Bed Mobility: Sit to Supine       Sit to supine: Min assist   General bed mobility comments: assist for R LE (and maintaining R post THP's)  Transfers Overall transfer level: Needs assistance Equipment used: Rolling walker (2 wheeled) Transfers: Sit to/from Stand Sit to Stand: Supervision         General  transfer comment: pt's husband able to provide occasional vc's required for R LE positioning with transfers (for posterior R THP's)  Ambulation/Gait Ambulation/Gait assistance: Supervision Ambulation Distance (Feet): 230 Feet Assistive device: Rolling walker (2 wheeled)       General Gait Details: mildly antalgic; mild decreased stance time R LE (pt initially with decreased cadence but with distance pt with increased cadence and occasionally required vc's to slow down cadence).   Stairs Stairs: Yes Stairs assistance: Min guard Stair Management: One rail Right Number of Stairs: 3 General stair comments: step to; B UE's on R rail (side-stepping); vc's required initially for technique  Wheelchair Mobility    Modified Rankin (Stroke Patients Only)       Balance                                    Cognition Arousal/Alertness: Awake/alert Behavior During Therapy: Anxious Overall Cognitive Status: Within Functional Limits for tasks assessed       Memory: Decreased recall of precautions (Pt able to name 2/3 post THP's (pt's husband able to name 3/3).)              Exercises      General Comments  Pt agreeable to PT session.  Pt's husband present entire session.      Pertinent Vitals/Pain Pain Assessment: 0-10 Pain Score: 5  Pain Location: R hip Pain Descriptors / Indicators: Sore;Tightness;Other (Comment) (Swollen) Pain Intervention(s): Limited activity within patient's tolerance;Monitored during session;Premedicated before session;Repositioned  HR 97-107 bpm during  session and O2 >91% during session on room air.     Home Living                      Prior Function            PT Goals (current goals can now be found in the care plan section) Acute Rehab PT Goals Patient Stated Goal: Go home PT Goal Formulation: With patient Time For Goal Achievement: 10/24/14 Potential to Achieve Goals: Good Progress towards PT goals: Progressing  toward goals    Frequency  BID    PT Plan Current plan remains appropriate    Co-evaluation             End of Session Equipment Utilized During Treatment: Gait belt Activity Tolerance: Patient tolerated treatment well (Increased time required d/t pt's anxiety in general.) Patient left: in bed;with call bell/phone within reach;with bed alarm set;with nursing/sitter in room;with family/visitor present (pillows placed between pt's knees)     Time: 1478-2956 PT Time Calculation (min) (ACUTE ONLY): 54 min  Charges:  $Gait Training: 23-37 mins $Therapeutic Activity: 23-37 mins                    G CodesHendricks Limes 30-Oct-2014, 3:09 PM Hendricks Limes, PT [phone]

## 2014-10-11 NOTE — Care Management (Signed)
Important Message  Patient Details  Name: Monica DuvalJoan [name] MRN: [id number] Date of Birth: [birth year]   Medicare Important Message Given:  Yes-second notification given    Olegario MessierKathy A Allmond 10/11/2014, 1:22 PM

## 2014-10-11 NOTE — Progress Notes (Addendum)
   Subjective: 2 Days Post-Op Procedure(s) (LRB): TOTAL HIP ARTHROPLASTY (Right) Patient reports pain as mild.   Patient is well, and has had no acute complaints or problems We'll continue with physical therapy today.  Plan is to go Home after hospital stay. no nausea and no vomiting Patient denies any chest pains or shortness of breath. Objective: Vital signs in last 24 hours: Temp:  [97.6 F (36.4 C)-99.5 F (37.5 C)] 98.7 F (37.1 C) (07/08 0405) Pulse Rate:  [89-111] 97 (07/08 0405) Resp:  [16-20] 18 (07/08 0405) BP: (98-121)/(55-70) 117/55 mmHg (07/08 0405) SpO2:  [93 %-100 %] 95 % (07/08 0405) well approximated incision Heels are non tender and elevated off the bed using rolled towels Intake/Output from previous day: 07/07 0701 - 07/08 0700 In: 760 [P.O.:360; I.V.:400] Out: 1200 [Urine:1150; Drains:50] Intake/Output this shift:     Recent Labs  10/10/14 0530 10/11/14 0424  HGB 9.7* 10.0*    Recent Labs  10/10/14 0530 10/11/14 0424  WBC 7.8 9.6  RBC 3.32* 3.41*  HCT 29.3* 29.9*  PLT 165 161    Recent Labs  10/10/14 0530 10/11/14 0424  NA 138 139  K 3.4* 3.5  CL 106 106  CO2 25 27  BUN 9 11  CREATININE 0.42* 0.38*  GLUCOSE 109* 111*  CALCIUM 8.4* 8.5*   No results for input(s): LABPT, INR in the last 72 hours.  EXAM General - Patient is Alert, Appropriate and Oriented Extremity - Neurologically intact Neurovascular intact Sensation intact distally Intact pulses distally Dorsiflexion/Plantar flexion intact Dressing - scant drainage to the posterior proximal portion of the dressing. Motor Function - intact, moving foot and toes well on exam. Patient moving left leg well.  Past Medical History  Diagnosis Date  . Arthritis   . Complication of anesthesia     woke up agitated following hernia surgery 02/2014  . Anxiety   . GERD (gastroesophageal reflux disease)     Assessment/Plan: 2 Days Post-Op Procedure(s) (LRB): TOTAL HIP  ARTHROPLASTY (Right) Principal Problem:   Degenerative joint disease (DJD) of hip Active Problems:   S/P total hip arthroplasty  Estimated body mass index is 19.47 kg/(m^2) as calculated from the following:   Height as of this encounter: 5\' 5"  (1.651 m).   Weight as of this encounter: 53.071 kg (117 lb). Up with therapy Plan for discharge tomorrow Discharge home with home health  Labs: Were reviewed on today's visit DVT Prophylaxis - Lovenox, Foot Pumps and TED hose Weight-Bearing as tolerated to right leg Patient needs a bowel movement today Hemovac and dressing was changed on today's visit Orders and prescriptions have been written  Cletis AthensJon R. Allen [facility] HospitalWolfe PA [facility] 10/11/2014, 7:38 AM

## 2014-10-12 MED ORDER — LACTULOSE 10 GM/15ML PO SOLN: 10.0000 g | Freq: Two times a day (BID) | ORAL | Status: DC | PRN

## 2014-10-12 NOTE — Discharge Summary (Signed)
Physician Discharge Summary  Patient ID: Monica [name] MRN: [id number] DOB/AGE: Jan 04, 1941 74 y.o.  Admit date: 10/09/2014 Discharge date: 10/12/2014  Admission Diagnoses:  DEGENERATIVE OSTEOARTHRITIS    Discharge Diagnoses: Patient Active Problem List   Diagnosis Date Noted  . Degenerative joint disease (DJD) of hip 10/09/2014  . S/P total hip arthroplasty 10/09/2014    Past Medical History  Diagnosis Date  . Arthritis   . Complication of anesthesia     woke up agitated following hernia surgery 02/2014  . Anxiety   . GERD (gastroesophageal reflux disease)      Transfusion: No transfusions given during this admission   Consultants (if any):  : No consultations on this admission  Discharged Condition: Improved  Hospital Course: Monica [name] is an 74 y.o. female who was admitted 10/09/2014 with a diagnosis of degenerative arthrosis of right hip and went to the operating room on 10/09/2014 and underwent the above named procedures.    Surgeries:Procedure(s): TOTAL HIP ARTHROPLASTY on 10/09/2014  ANESTHESIA: spinal  ESTIMATED BLOOD LOSS: 300 mL  FLUIDS REPLACED: 1200 mL of crystalloid  DRAINS: 2 medium drains to a Hemovac reservoir  IMPLANTS UTILIZED: DePuy 13.5 mm small stature AML femoral stem, 60 mm OD [employer] acetabular component, neutral [employer] polyethylene insert, and a 36 mm M-SPEC +1.5 mm hip ball, 2 - 6.32mm cancellous screws  INDICATIONS FOR SURGERY: Monica [name] is a 74 y.o. year old female with a long history of progressive hip and groin pain. X-rays demonstrated severe degenerative changes. The patient had not seen any significant improvement despite conservative nonsurgical intervention.   The risks, benefits, and alternatives were discussed at length including but not limited to the risks of infection, bleeding, nerve injury, stiffness, blood clots, the need for revision surgery, limb length inequality, dislocation, cardiopulmonary  complications, among others, and they were willing to proceed.  Patient tolerated the surgery well. No complications .Patient was taken to PACU where she was stabilized and then transferred to the orthopedic floor.  Patient started on Lovenox 30 mg q 12 hrs. Foot pumps applied bilaterally at 80 mmHg. Heels elevated off bed with rolled towels. No evidence of DVT. Calves non tender. Negative Homan. Physical therapy started on day #1 for gait training and transfer with OT starting on  day #1 for ADL and assisted devices. Patient has done well with therapy. Ambulated greater than 200 feet upon being discharged. Was able to go up 4 steps safely. Able to get in and out of bed on her own.  Patient's IV , foley day #1 and hemovac was d/c on day #2.   She was given perioperative antibiotics:  Anti-infectives    Start     Dose/Rate Route Frequency Ordered Stop   10/09/14 1145  clindamycin (CLEOCIN) IVPB 600 mg     600 mg 100 mL/hr over 30 Minutes Intravenous Every 6 hours 10/09/14 1142 10/10/14 0545   10/09/14 0545  clindamycin (CLEOCIN) IVPB 900 mg     900 mg 100 mL/hr over 30 Minutes Intravenous  Once 10/09/14 0542 10/09/14 0739    .  She was given sequential compression devices, early ambulation, and Lovenox and TED stockings for DVT prophylaxis. Heels elevated off bed and instructed on heel pumps  She benefited maximally from the hospital stay and there were no complications.    Recent vital signs:  Filed Vitals:   10/12/14 0504  BP: 110/65  Pulse: 107  Temp: 98.2 F (36.8 C)  Resp: 18    Recent  laboratory studies:  Lab Results  Component Value Date   HGB 10.0* 10/11/2014   HGB 9.7* 10/10/2014   HGB 13.1 09/25/2014   Lab Results  Component Value Date   WBC 9.6 10/11/2014   PLT 161 10/11/2014   Lab Results  Component Value Date   INR 0.96 09/25/2014   Lab Results  Component Value Date   NA 139 10/11/2014   K 3.5 10/11/2014   CL 106 10/11/2014   CO2 27 10/11/2014    BUN 11 10/11/2014   CREATININE 0.38* 10/11/2014   GLUCOSE 111* 10/11/2014    Discharge Medications:     Medication List    TAKE these medications        acetaminophen 650 MG CR tablet  Commonly known as:  TYLENOL  Take 650 mg by mouth every 8 (eight) hours as needed for pain.     acetaminophen 500 MG tablet  Commonly known as:  TYLENOL  Take 500 mg by mouth every 6 (six) hours as needed.     ALPRAZolam 0.25 MG tablet  Commonly known as:  XANAX  Take 0.25 mg by mouth daily as needed for anxiety.     CALCIUM 600/VITAMIN D3 600-800 MG-UNIT Tabs  Generic drug:  Calcium Carb-Cholecalciferol  Take 2 tablets by mouth daily.     Camphor-Menthol-Methyl Sal 1.2-5.7-6.3 % Ptch  Apply 1 application topically.     [employer] ANTACID FAST RELIEF PO  Take 1 tablet by mouth daily as needed.     [employer]S SPECTRAVITE ADULT 50+ PO  Take 1 tablet by mouth daily.     enoxaparin 30 MG/0.3ML injection  Commonly known as:  LOVENOX  Inject 0.3 mLs (30 mg total) into the skin daily.     oxyCODONE 5 MG immediate release tablet  Commonly known as:  Oxy IR/ROXICODONE  Take 1-2 tablets (5-10 mg total) by mouth every 4 (four) hours as needed for breakthrough pain.     traMADol 50 MG tablet  Commonly known as:  ULTRAM  Take 1-2 tablets (50-100 mg total) by mouth every 4 (four) hours as needed for moderate pain.     trolamine salicylate 10 % cream  Commonly known as:  ASPERCREME  Apply 1 application topically as needed for muscle pain.        Diagnostic Studies: Dg Hip Port Unilat With Pelvis 1v Right  10/09/2014   CLINICAL DATA:  74 year old female status post hip arthroplasty. Initial encounter.  EXAM: RIGHT HIP (WITH PELVIS) 1 VIEW PORTABLE  COMPARISON:  CT Abdomen and Pelvis 02/13/2014.  FINDINGS: Portable AP and cross-table lateral views of the right hip and pelvis at 1104 hrs. Sequelae of right total hip arthroplasty. Hardware appears intact and normally aligned. Postoperative drain is in place.  Overlying postoperative skin staples.  Moderate to severe left hip osteoarthritis re- identified. No new osseous abnormality.  IMPRESSION: 1. Right total hip arthroplasty with no adverse features. 2. Chronic left hip osteoarthritis.   Electronically Signed   By: Monica [name] M.D.   On: 10/09/2014 11:21    Disposition: Final discharge disposition not confirmed      Discharge Instructions    Diet - low sodium heart healthy    Complete by:  As directed      Increase activity slowly    Complete by:  As directed            Follow-up Information    Follow up with Donato HeinzHOOTEN,JAMES P, MD On 11/11/2014.   Specialty:  Orthopedic Surgery  Why:  at 1115   Contact information:   [address] [place] [address] [phone]        Signed: Tera Partridge 10/12/2014, 6:49 AM

## 2014-10-12 NOTE — Care Management Note (Signed)
Case Management Note  Patient Details  Name: Monica DuvalJoan [name] MRN: [id number] Date of Birth: 1940/08/27  Subjective/Objective:       [facility]charge [facility] fax[facility] Monica [name] has [name] BSC and [name] rolling walker. Lovenox called to pharmacy by Carol AdaAngelia Johnson earlier this week.              Action/Plan:   Expected Discharge Date:  10/12/14               Expected Discharge Plan:     In-House Referral:     Discharge planning Services  CM Consult  Post Acute Care Choice:    Choice offered to:  Patient, Spouse  DME Arranged:    DME Agency:     HH Arranged:  PT [facility]  Status of Service:     Medicare Important Message Given:  Yes-second notification given Date Medicare IM Given:    Medicare IM give by:    Date Additional Medicare IM Given:    Additional Medicare Important Message give by:     If discussed at Long Length of Stay Meetings, dates discussed:    Additional Comments:  Monica Sealy A, RN 10/12/2014, 8:33 AM

## 2014-10-12 NOTE — Progress Notes (Signed)
Physical Therapy Treatment Patient Details Name: Monica DuvalJoan [name] MRN: [id number] DOB: 01-01-1941 Today's Date: 10/12/2014    History of Present Illness This patient is a 74 year old female who [facility] (posterior approach)    PT Comments    Pt is progressing well with therapy as demonstrated my increased gait distance and progression of gait pattern.  Pt needs help remembering sequencing with transfers and often forgets to kick her R leg out before sitting/staning.  Pt's husband is very attentive and sometimes overly so, not allowing pt to practice what she has learned.  Encourage pt to "remember" and "stop and think" before getting up/down from chair.  Verbally reviewed car transfer with patient and spouse with verbal understanidng.    Follow Up Recommendations  Home health PT;Supervision/Assistance - 24 hour     Equipment Recommendations       Recommendations for Other Services       Precautions / Restrictions Precautions Precautions: Posterior Hip;Fall Precaution Comments: Hip abduction pillow Restrictions RLE Weight Bearing: Weight bearing as tolerated    Mobility  Bed Mobility Overal bed mobility:  (Pt received up in chair.)                Transfers Overall transfer level: Needs assistance Equipment used: Rolling walker (2 wheeled) Transfers: Sit to/from Stand Sit to Stand: Supervision         General transfer comment: Needs verbal cues and step-by step instructions.  Pt "can't remember" how to position hands/feet, husband at bedside cueing throughtout session.  Encouraged pt to remember on her own and to trust herself.  Ambulation/Gait Ambulation/Gait assistance: Supervision Ambulation Distance (Feet): 245 Feet Assistive device: Rolling walker (2 wheeled) Gait Pattern/deviations: Decreased step length - right     General Gait Details: Step-to gait pattern with instruction to step through with visual cue to keep walker moving with Min A, cues  for push off on R foot   Stairs            Wheelchair Mobility    Modified Rankin (Stroke Patients Only)       Balance                                    Cognition Arousal/Alertness: Awake/alert Behavior During Therapy: Anxious Overall Cognitive Status: Within Functional Limits for tasks assessed       Memory: Decreased recall of precautions              Exercises Other Exercises Other Exercises: Seated AP, QS, GS, HS, ABB/ADD x 10 reps each    General Comments        Pertinent Vitals/Pain Pain Descriptors / Indicators:  (sore, tight) Pain Intervention(s): Monitored during session    Home Living                      Prior Function            PT Goals (current goals can now be found in the care plan section) Acute Rehab PT Goals Patient Stated Goal: Go home PT Goal Formulation: With patient Time For Goal Achievement: 10/24/14 Potential to Achieve Goals: Good Progress towards PT goals: Progressing toward goals    Frequency  BID    PT Plan Current plan remains appropriate    Co-evaluation             End of Session Equipment  Utilized During Treatment: Gait belt Activity Tolerance: Patient tolerated treatment well Patient left: in bed;with call bell/phone within reach;with bed alarm set;with nursing/sitter in room;with family/visitor present     Time: 9604-5409 PT Time Calculation (min) (ACUTE ONLY): 45 min  Charges:  $Gait Training: 23-37 mins $Therapeutic Exercise: 8-22 mins                    G Codes:      Monica [name] A Monica [name] 18-Oct-2014, 10:09 AM

## 2014-10-12 NOTE — Plan of Care (Signed)
Problem: Discharge Progression Outcomes Goal: Other Discharge Outcomes/Goals Outcome: Progressing Pt able to get out of bed with minimal assistance to the bedside commode. Understands how to transfer and ambulate with walker. Ask appropriate questions and demonstrates understanding.  POD 3. Pt reports she is ready to be discharged.  Pain more controlled at this time.

## 2014-10-12 NOTE — Progress Notes (Signed)
   Subjective: 3 Days Post-Op Procedure(s) (LRB): TOTAL HIP ARTHROPLASTY (Right) Patient reports pain as mild.   Patient is well, and has had no acute complaints or problems Continue with physical therapy today.  Plan is to go Home after hospital stay. no nausea and no vomiting Patient denies any chest pains or shortness of breath. Objective: Vital signs in last 24 hours: Temp:  [98.2 F (36.8 C)-99 F (37.2 C)] 98.2 F (36.8 C) (07/09 0504) Pulse Rate:  [97-110] 107 (07/09 0504) Resp:  [16-18] 18 (07/09 0504) BP: (109-115)/(51-65) 110/65 mmHg (07/09 0504) SpO2:  [94 %-99 %] 94 % (07/09 0504) well approximated incision Heels are non tender and elevated off the bed using rolled towels Intake/Output from previous day: 07/08 0701 - 07/09 0700 In: 360 [P.O.:360] Out: 1625 [Urine:1625] Intake/Output this shift: Total I/O In: -  Out: 600 [Urine:600]   Recent Labs  10/10/14 0530 10/11/14 0424  HGB 9.7* 10.0*    Recent Labs  10/10/14 0530 10/11/14 0424  WBC 7.8 9.6  RBC 3.32* 3.41*  HCT 29.3* 29.9*  PLT 165 161    Recent Labs  10/10/14 0530 10/11/14 0424  NA 138 139  K 3.4* 3.5  CL 106 106  CO2 25 27  BUN 9 11  CREATININE 0.42* 0.38*  GLUCOSE 109* 111*  CALCIUM 8.4* 8.5*   No results for input(s): LABPT, INR in the last 72 hours.  EXAM General - Patient is Alert, Appropriate and Oriented Extremity - Neurologically intact Neurovascular intact Sensation intact distally Intact pulses distally Dorsiflexion/Plantar flexion intact Dressing - scant drainage, serosanguineous Motor Function - intact, moving foot and toes well on exam.    Past Medical History  Diagnosis Date  . Arthritis   . Complication of anesthesia     woke up agitated following hernia surgery 02/2014  . Anxiety   . GERD (gastroesophageal reflux disease)     Assessment/Plan: 3 Days Post-Op Procedure(s) (LRB): TOTAL HIP ARTHROPLASTY (Right) Principal Problem:   Degenerative  joint disease (DJD) of hip Active Problems:   S/P total hip arthroplasty  Estimated body mass index is 19.47 kg/(m^2) as calculated from the following:   Height as of this encounter: 5\' 5"  (1.651 m).   Weight as of this encounter: 53.071 kg (117 lb). Up with therapy Discharge home with home health today after patient has a bowel movement and does physical therapy this morning  Labs: None for today DVT Prophylaxis - Lovenox, Foot Pumps and TED hose Weight-Bearing as tolerated to right leg Patient needs to have a bowel movement today.     Lynnda ShieldsJon R. [facility]fe PA [facility] 10/12/2014, 6:46 AM

## 2014-10-12 NOTE — Progress Notes (Signed)
Instructions reviewed with the pt  And her husband.  rx given to the husband. Pt sent out via wheelchair with belongings

## 2015-04-18 ENCOUNTER — Ambulatory Visit
Admission: RE | Admit: 2015-04-18 | Discharge: 2015-04-18 | Disposition: A | Discharge status: Home / Self Care | Payer: Medicare Other | Source: Ambulatory Visit | Attending: Internal Medicine | Admitting: Internal Medicine

## 2015-04-18 ENCOUNTER — Other Ambulatory Visit: Payer: Self-pay | Admitting: Internal Medicine

## 2015-04-18 DIAGNOSIS — Z1231 Encounter for screening mammogram for malignant neoplasm of breast: Secondary | ICD-10-CM | POA: Diagnosis not present

## 2015-05-20 ENCOUNTER — Ambulatory Visit: Payer: Medicare Other | Attending: Otolaryngology

## 2015-05-20 DIAGNOSIS — R2681 Unsteadiness on feet: Secondary | ICD-10-CM | POA: Diagnosis present

## 2015-05-20 DIAGNOSIS — R531 Weakness: Secondary | ICD-10-CM | POA: Insufficient documentation

## 2015-05-20 NOTE — Therapy (Addendum)
[place] [facility] MAIN [facility] SERVICES [address] [place], [place], [address] Phone: [phone]   Fax:  [phone]  Physical Therapy Evaluation  Patient Details  Name: Monica [name] MRN: [id number] Date of Birth: 10/12/1940 Referring Provider: Dr Bud Face  Encounter Date: 05/20/2015      PT End of Session - 05/20/15 1201    Visit Number 1   Number of Visits 9   Date for PT Re-Evaluation 06/17/15   Authorization Type 1/10   PT Start Time 1030   PT Stop Time 1130   PT Time Calculation (min) 60 min   Equipment Utilized During Treatment Gait belt   Activity Tolerance Patient limited by fatigue;Patient tolerated treatment well   Behavior During Therapy [facility] for tasks assessed/performed;Anxious      Past Medical History  Diagnosis Date  . Arthritis   . Complication of anesthesia     woke up agitated following hernia surgery 02/2014  . Anxiety   . GERD (gastroesophageal reflux disease)     Past Surgical History  Procedure Laterality Date  . Hernia repair    . 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  Visit Diagnosis:  Generalized weakness  Unsteadiness      Subjective Assessment - 05/20/15 1037    Subjective pt states that she had R hip replaced 6 months ago. pt reports that she had an ear infection with possible damage but balance issues are not coming from inner ear per the ENT and was sent to PT for gait and balance. pt reports dizziness comes at different times of day, not everyday and sitting helps. pt describes dizziness as "feeling lightheaded". pt states that she has had low BP previously recorded but has not had any issues recently. pt states that she has occasional back pain that comes and goes as well. She reports having balance issues since a few  months before her hip replacement. She admits she has some "paranoia" about her balance.    Patient Stated Goals pick up something up off floor   Currently in Pain? No/denies   Multiple Pain Sites No            [facility] PT Assessment - 05/20/15 0001    Assessment   Medical Diagnosis Dizziness   Referring Provider Dr Bud Face   Onset Date/Surgical Date 02/04/15   Next MD Visit may 2017   Prior Therapy no   Precautions   Precautions Fall   Restrictions   Weight Bearing Restrictions No   Balance Screen   Has the patient fallen in the past 6 months No   Has the patient had a decrease in activity level because of a fear of falling?  No   Is the patient reluctant to leave their home because of a fear of falling?  No   Home Environment   Living Environment Private residence   Living Arrangements Spouse/significant other   Available Help at Discharge Family   Type of Home House   Home Access Stairs to enter   Entrance Stairs-Number of Steps 3   Entrance Stairs-Rails Can reach both   Home Layout One level   Home Equipment [place] - single point   Prior Function   Level of Independence Independent;Independent with basic ADLs;Independent with household mobility without device   Vocation  Retired   Standardized Balance Assessment   Standardized Balance Assessment Engineer, structural Test   Sit to Stand Able to stand  independently using hands   Standing Unsupported Able to stand safely 2 minutes   Sitting with Back Unsupported but Feet Supported on Floor or Stool Able to sit safely and securely 2 minutes   Stand to Sit Controls descent by using hands   Transfers Able to transfer safely, definite need of hands   Standing Unsupported with Eyes Closed Able to stand 10 seconds with supervision   Standing Ubsupported with Feet Together Able to place feet together independently but unable to hold for 30 seconds   From Standing, Reach Forward with Outstretched Arm Can reach  forward >12 cm safely (5")   From Standing Position, Pick up Object from Floor Unable to pick up and needs supervision   From Standing Position, Turn to Look Behind Over each Shoulder Turn sideways only but maintains balance   Turn 360 Degrees Able to turn 360 degrees safely but slowly   Standing Unsupported, Alternately Place Feet on Step/Stool Able to stand independently and complete 8 steps >20 seconds   Standing Unsupported, One Foot in Front Able to take small step independently and hold 30 seconds   Standing on One Leg Able to lift leg independently and hold 5-10 seconds   Total Score 38     Outcome Measures taken by student of PT:     POSTURE/OBSERVATION: Mild forward head posture. Pt is mildly agitated   PROM/AROM:  WFL  STRENGTH:  Graded on a 0-5 scale Muscle Group  Left  Right    Shoulder Flex       Shoulder Abd       Shoulder Ex       Horizontal Abd       Horizontal Add       Elbow Flex      Elbow Ex    Wrist Flex    Wrist Ex    Hip Flex  4 4-  Hip Abd  4- 4-  Hip Add  3+ 3+  Hip Ext     Hip IR/ER       Knee Flex  4 4-  Knee Ext  4+ 4  Ankle DF  5 4+  Ankle PF        BALANCE: Impaired static and dynamic balance. See Sharlene Motts  GAIT: Pt has some lateral deviations in gait, stumbling, pt able to walk without AD, but needs SBA. Pt had short step length and reduced foot clearance   OUTCOME MEASURES: TEST  Outcome  Interpretation   5 times sit<>stand   63 sec  >60 yo, >15 sec indicates increased risk for falls   10 meter walk test            0.5 m/s  <1.0 m/s indicates increased risk for falls; limited community ambulator   Timed up and Go          31.47 sec  <14 sec indicates increased risk for falls   6 minute walk test                 Feet  1000 feet is community Licensed conveyancer          38/56 <36/56 (100% risk for falls), 37-45 (80% risk for falls); 46-51 (>50% risk for falls); 52-55 (lower risk <25% of falls)  PT Education  - 2015-06-06 1200    Education provided Yes   Education Details pt educated in plan of care, outcome measures and results   Person(s) Educated Patient   Methods Explanation   Comprehension Verbalized understanding             PT Long Term Goals - 06/06/15 1259    PT LONG TERM GOAL #1   Title pt will improve Berg Balance score to >45 to decrease fall risk    Baseline 38   Time 4   Period Weeks   Status New   PT LONG TERM GOAL #2   Title pt will improve 5xSTS to 30 seconds to demonstrate improved strength   Baseline 63 sec   Time 4   Period Weeks   Status New   PT LONG TERM GOAL #3   Title pt will improve 24m walk speed to 0.8 m/s for safe household mobility   Baseline 0.5 m/s   Time 4   Period Weeks   Status New   PT LONG TERM GOAL #4   Title pt will improved TUG time to <20 sec for independent mobile   Baseline 31.47   Time 4   Period Weeks   Status New   PT LONG TERM GOAL #5   Title pt will be able to ascend and decend 3 stairs in step over step pattern in order to independently enter home   Time 4   Period Weeks   Status New               Plan - 06/06/2015 1308    Clinical Impression Statement pt presents with previous R hip replacement 6 months ago with no residual pain. pt has generalized weakness R>L and feels unsteady on feet due to inactivity. pt currently ambulates with a cane to help her feel more steady and demonstrates some lateral deviation in gait. pt demonstrates hip flexor and abductor weakness. pt has negative views about her ability to do activity and did not attempt certain activities due to fear and anxiety. pt fatigued during outcome measures and required rest breaks between each. pt did not report an increase in pain or any numbness or tingling. pt will benefit from therapy in order to increase strength, balance, gait and tolerance of physical activity to reduce fall risk.    Pt will benefit from skilled therapeutic intervention in order to  improve on the following deficits Abnormal gait;Decreased activity tolerance;Decreased balance;Decreased endurance;Decreased strength;Dizziness   Rehab Potential Fair   PT Frequency 2x / week   PT Duration 4 weeks   PT Treatment/Interventions ADLs/Self Care Home Management;Electrical Stimulation;Cryotherapy;Traction;Ultrasound;Gait training;Stair training;Functional mobility training;Therapeutic exercise;Balance training;Neuromuscular re-education;Patient/family education;Manual techniques;Passive range of motion;Energy conservation   PT Next Visit Plan HEP   Consulted and Agree with Plan of Care Patient          G-Codes - 06-06-15 1454    Functional Assessment Tool Used Berg/MMT/5xsittostand/51m walk   Functional Limitation Mobility: Walking and moving around   Mobility: Walking and Moving Around Current Status [phone]) At least 40 percent but less than 60 percent impaired, limited or restricted   Mobility: Walking and Moving Around Goal Status (U0454) At least 1 percent but less than 20 percent impaired, limited or restricted       Problem List Patient Active Problem List   Diagnosis Date Noted  . Degenerative joint disease (DJD) of hip 10/09/2014  . S/P total hip arthroplasty 10/09/2014   Estella Husk, SPT This entire  session was performed under direct supervision and direction of a licensed Estate agent . I have personally read, edited and approve of the note as written.  [name]. Tortorici, PT, DPT [phone]   Tortorici,Ashley 05/20/2015, 2:55 PM  [place] [facility] MAIN [facility] SERVICES [address] [place], [place], [address] Phone: [phone]   Fax:  [phone]  Name: Monica [name] MRN: [id number] Date of Birth: 1941/03/15

## 2015-05-22 ENCOUNTER — Ambulatory Visit: Payer: Medicare Other

## 2015-05-27 ENCOUNTER — Ambulatory Visit: Payer: Medicare Other

## 2015-05-27 DIAGNOSIS — R2681 Unsteadiness on feet: Secondary | ICD-10-CM

## 2015-05-27 DIAGNOSIS — R531 Weakness: Secondary | ICD-10-CM

## 2015-05-27 NOTE — Therapy (Signed)
[place] [facility] MAIN [facility] SERVICES [address] [place], [place], [address] Phone: [phone]   Fax:  [phone]  Physical Therapy Treatment  Patient Details  Name: Monica [name] MRN: [id number] Date of Birth: 12/06/1940 Referring Provider: Dr Bud Face  Encounter Date: 05/27/2015      PT End of Session - 05/27/15 1326    Visit Number 2   Number of Visits 9   Date for PT Re-Evaluation 06/17/15   Authorization Type 2/10   PT Start Time 1115   PT Stop Time 1200   PT Time Calculation (min) 45 min   Equipment Utilized During Treatment Gait belt   Activity Tolerance Patient limited by fatigue;Patient tolerated treatment well   Behavior During Therapy [facility] for tasks assessed/performed;Anxious      Past Medical History  Diagnosis Date  . Arthritis   . Complication of anesthesia     woke up agitated following hernia surgery 02/2014  . Anxiety   . GERD (gastroesophageal reflux disease)     Past Surgical History  Procedure Laterality Date  . Hernia repair    . 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  Visit Diagnosis:  Generalized weakness  Unsteadiness      Subjective Assessment - 05/27/15 1324    Subjective "I am paranoid to do exercises" pt reports she has no pain.    Patient Stated Goals pick up something up off floor, reach her shoes, feel more confident moving   Currently in Pain? No/denies     Therex:  SKTC 10s x 10  Modified figure 3 piriformis stretch 10s x 3 each side Sit to stand from elevated chair x 5 ; ; standing hip abd 2x10; standing march 2x10; standing hip extension 2x10; bridges 2x10 Pt requires mod verbal and tactile cues for proper exercise performance  And education throughout. Pt requires extended time to complete exercises. Pt  encouraged to increase cadence                             PT Education - 05/27/15 1325    Education provided Yes   Education Details extensive patient education regarding, no restirctions in hip movement at this time as it has been well over the threshold for posterior hip precuations. education regarding importance of lifting fear-avoidance in order to be more active and move more. education that arthritis dx does no necessarally equate to pain    Person(s) Educated Patient   Methods Explanation   Comprehension Verbalized understanding             PT Long Term Goals - 05/20/15 1259    PT LONG TERM GOAL #1   Title pt will improve Berg Balance score to >45 to decrease fall risk    Baseline 38   Time 4   Period Weeks   Status New   PT LONG TERM GOAL #2   Title pt will improve 5xSTS to 30 seconds to demonstrate improved strength   Baseline 63 sec   Time 4   Period Weeks   Status New   PT LONG TERM GOAL #3   Title pt will improve 61m walk speed to 0.8 m/s for safe household mobility   Baseline 0.5 m/s   Time 4  Period Weeks   Status New   PT LONG TERM GOAL #4   Title pt will improved TUG time to <20 sec for independent mobile   Baseline 31.47   Time 4   Period Weeks   Status New   PT LONG TERM GOAL #5   Title pt will be able to ascend and decend 3 stairs in step over step pattern in order to independently enter home   Time 4   Period Weeks   Status New               Plan - 05/27/15 1326    Clinical Impression Statement pt did well with progression of HEP/therex today. she does need quite a bit of cuing and encouragement to work past her anxiety about movement and exercise, however had improved performance with practice. pt is motivated to continue with therapy to improve her function.    Pt will benefit from skilled therapeutic intervention in order to improve on the following deficits Abnormal gait;Decreased activity tolerance;Decreased  balance;Decreased endurance;Decreased strength;Dizziness   Rehab Potential Fair   PT Frequency 2x / week   PT Duration 4 weeks   PT Treatment/Interventions ADLs/Self Care Home Management;Electrical Stimulation;Cryotherapy;Traction;Ultrasound;Gait training;Stair training;Functional mobility training;Therapeutic exercise;Balance training;Neuromuscular re-education;Patient/family education;Manual techniques;Passive range of motion;Energy conservation   PT Next Visit Plan HEP   Consulted and Agree with Plan of Care Patient        Problem List Patient Active Problem List   Diagnosis Date Noted  . Degenerative joint disease (DJD) of hip 10/09/2014  . S/P total hip arthroplasty 10/09/2014   [name]. Aleksandra Raben, PT, DPT [phone]  Byrne Capek 05/27/2015, 3:09 PM  [place] [facility] MAIN [facility] SERVICES [address] [place], [place], [address] Phone: [phone]   Fax:  [phone]  Name: Reece Mcbroom MRN: [id number] Date of Birth: 1940/09/07

## 2015-05-28 ENCOUNTER — Encounter: Payer: Self-pay | Admitting: Orthopedic Surgery

## 2015-05-29 ENCOUNTER — Ambulatory Visit: Payer: Medicare Other

## 2015-05-29 DIAGNOSIS — R531 Weakness: Secondary | ICD-10-CM

## 2015-05-29 DIAGNOSIS — R2681 Unsteadiness on feet: Secondary | ICD-10-CM

## 2015-05-29 NOTE — Therapy (Signed)
[place] [facility] MAIN [facility] SERVICES [address] [place], [place], [address] Phone: [phone]   Fax:  [phone]  Physical Therapy Treatment  Patient Details  Name: Monica [name] MRN: [id number] Date of Birth: January 03, 1941 Referring Provider: Dr Bud Face  Encounter Date: 05/29/2015      PT End of Session - 05/29/15 1503    Visit Number 3   Number of Visits 9   Date for PT Re-Evaluation 06/17/15   Authorization Type 3/10   PT Start Time 1115   PT Stop Time 1200   PT Time Calculation (min) 45 min   Equipment Utilized During Treatment Gait belt   Activity Tolerance Patient limited by fatigue;Patient tolerated treatment well   Behavior During Therapy [facility] for tasks assessed/performed;Anxious      Past Medical History  Diagnosis Date  . Arthritis   . Complication of anesthesia     woke up agitated following hernia surgery 02/2014  . Anxiety   . GERD (gastroesophageal reflux disease)     Past Surgical History  Procedure Laterality Date  . Hernia repair    . 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  Visit Diagnosis:  Unsteadiness  Generalized weakness      Subjective Assessment - 05/29/15 1502    Subjective pt reports she was compliant with HEP and she feels more positive towards PT   Patient Stated Goals pick up something up off floor, reach her shoes, feel more confident moving   Pain Score 1    Pain Location --  L knee      ThereX: Nustep cues to keep SPM >25 L 4 Leg press 75lbs 2x10 (BLE) Fwd step up onto 4inch step with single UE support x 10 each leg  Pt requires mod verbal and tactile cues for proper exercise performance And education throughout. Pt requires extended time to complete exercises. Pt encouraged to increase cadence    NMR: Side stepping with stepping over 1/2 roll in // bars x 6 laps no UE Fwd/retro walking in // bars without UE support x 5 laps, cues for inc . Step length Standing on AIREX NBOS 30sx  2 Weight shift on AIREX wide BOS SS and AP 1 min x 2 each EO/EC on AIREX 10s x 5 NBOS on AIREX with vertical/horiz head movement 2x10 each Pt requires mod verbal and tactile cues for proper exercise performance And education throughout. Pt requires extended time to complete exercises. Pt encouraged to increase cadence   pt requires CGA to min A for safety on balance exercises                          PT Education - 05/29/15 1502    Education provided Yes   Education Details balance training and systems of balance    Person(s) Educated Patient   Methods Explanation   Comprehension Verbalized understanding             PT Long Term Goals - 05/20/15 1259    PT LONG TERM GOAL #1   Title pt will improve Berg Balance score to >45 to decrease fall risk    Baseline 38   Time 4   Period Weeks   Status New   PT LONG TERM GOAL #2  Title pt will improve 5xSTS to 30 seconds to demonstrate improved strength   Baseline 63 sec   Time 4   Period Weeks   Status New   PT LONG TERM GOAL #3   Title pt will improve 42m walk speed to 0.8 m/s for safe household mobility   Baseline 0.5 m/s   Time 4   Period Weeks   Status New   PT LONG TERM GOAL #4   Title pt will improved TUG time to <20 sec for independent mobile   Baseline 31.47   Time 4   Period Weeks   Status New   PT LONG TERM GOAL #5   Title pt will be able to ascend and decend 3 stairs in step over step pattern in order to independently enter home   Time 4   Period Weeks   Status New               Plan - 05/29/15 1503    Clinical Impression Statement pt did well with progression of activities today, but does need quite a bit of encouragement and reinforcement during session. she has difficulty recovering from  posterior lean. and difficutly standing on compliant surface especially when relying on vestibular input.    Pt will benefit from skilled therapeutic intervention in order to improve on the following deficits Abnormal gait;Decreased activity tolerance;Decreased balance;Decreased endurance;Decreased strength;Dizziness   Rehab Potential Fair   PT Frequency 2x / week   PT Duration 4 weeks   PT Treatment/Interventions ADLs/Self Care Home Management;Electrical Stimulation;Cryotherapy;Traction;Ultrasound;Gait training;Stair training;Functional mobility training;Therapeutic exercise;Balance training;Neuromuscular re-education;Patient/family education;Manual techniques;Passive range of motion;Energy conservation   PT Next Visit Plan HEP   Consulted and Agree with Plan of Care Patient        Problem List Patient Active Problem List   Diagnosis Date Noted  . Degenerative joint disease (DJD) of hip 10/09/2014  . S/P total hip arthroplasty 10/09/2014   [name]. Jerrod Damiano, PT, DPT [phone]  Carlis Burnsworth 05/29/2015, 3:05 PM  [place] [facility] MAIN [facility] SERVICES [address] [place], [place], [address] Phone: [phone]   Fax:  [phone]  Name: Monica [name] MRN: [id number] Date of Birth: 1940-10-29

## 2015-06-03 ENCOUNTER — Ambulatory Visit: Payer: Medicare Other

## 2015-06-03 VITALS — BP 130/78 | HR 70

## 2015-06-03 DIAGNOSIS — R2681 Unsteadiness on feet: Secondary | ICD-10-CM

## 2015-06-03 DIAGNOSIS — R531 Weakness: Secondary | ICD-10-CM

## 2015-06-03 NOTE — Therapy (Signed)
[facility] MAIN [facility] SERVICES [address] [place], [place], [address] Phone: [phone]   Fax:  [phone]  Physical Therapy Treatment  Patient Details  Name: Monica [name] MRN: [id number] Date of Birth: 11/01/1940 Referring Provider: Dr Bud Face  Encounter Date: 06/03/2015      PT End of Session - 06/03/15 1418    Visit Number 4   Number of Visits 9   Date for PT Re-Evaluation 06/17/15   Authorization Type 4/10   PT Start Time 1115   PT Stop Time 1158   PT Time Calculation (min) 43 min   Equipment Utilized During Treatment Gait belt   Activity Tolerance Patient limited by fatigue;Patient tolerated treatment well   Behavior During Therapy [facility] for tasks assessed/performed;Anxious      Past Medical History  Diagnosis Date  . Arthritis   . Complication of anesthesia     woke up agitated following hernia surgery 02/2014  . Anxiety   . GERD (gastroesophageal reflux disease)     Past Surgical History  Procedure Laterality Date  . Hernia repair    . Total hip arthroplasty Right 10/09/2014    Procedure: TOTAL HIP ARTHROPLASTY;  Surgeon: Donato Heinz, MD;  Location: [facility] ORS;  Service: Orthopedics;  Laterality: Right;    Filed Vitals:   06/03/15 1417  BP: 130/78  Pulse: 70    Visit Diagnosis:  Unsteadiness  Generalized weakness      Subjective Assessment - 06/03/15 1417    Subjective pt reports she has been feeling a little more light headed today. she thinks she has allergies, also wants her BP taken    Currently in Pain? No/denies      Therex: Pt assessed orthostatic BP. In sitting 130/78, in standing 122/66mm/Hg Side stepping in // bars with yellow band x 4 laps Standing resisted hip flexion, abduction ,and extension with   yellow     Band x 10 each way  seated clamshell yellow band 2x10 Seated LAQ with ball squeeze x 10 each leg Pt requires mod verbal and tactile cues for proper exercise performance    NMR: Standing fwd/side lunges x 10 of each, bilaterally, no UE  pt requires CGA-min A for safety on balance exercises                            PT Education - 06/03/15 1418    Education provided Yes   Education Details motivational cues for challenging exercises   Person(s) Educated Patient   Methods Explanation   Comprehension Verbalized understanding             PT Long Term Goals - 05/20/15 1259    PT LONG TERM GOAL #1   Title pt will improve Berg Balance score to >45 to decrease fall risk    Baseline 38   Time 4   Period Weeks   Status New   PT LONG TERM GOAL #2   Title pt will improve 5xSTS to 30 seconds to demonstrate improved strength   Baseline 63 sec   Time 4   Period Weeks   Status New   PT LONG TERM GOAL #3   Title pt will improve 25m walk speed to 0.8 m/s for safe household mobility   Baseline 0.5 m/s   Time 4   Period Weeks   Status New   PT LONG TERM GOAL #4   Title pt will improved TUG time to <  20 sec for independent mobile   Baseline 31.47   Time 4   Period Weeks   Status New   PT LONG TERM GOAL #5   Title pt will be able to ascend and decend 3 stairs in step over step pattern in order to independently enter home   Time 4   Period Weeks   Status New               Plan - 06/03/15 1419    Clinical Impression Statement pt assessed orthostatic vital signs. pt does have drop in systolic BP with standing. pt did well with progression of strengthening and balance, however does need almost max cues during exercises for encouragement and motivation. she remains motivated to continue therapy. pt needs extended time to complete exercises as she tends to analyse each repitition.     Pt will benefit from skilled therapeutic intervention in order to improve on the following deficits Abnormal gait;Decreased activity tolerance;Decreased balance;Decreased endurance;Decreased strength;Dizziness   Rehab Potential Poor   PT  Frequency 2x / week   PT Duration 4 weeks   PT Treatment/Interventions ADLs/Self Care Home Management;Electrical Stimulation;Cryotherapy;Traction;Ultrasound;Gait training;Stair training;Functional mobility training;Therapeutic exercise;Balance training;Neuromuscular re-education;Patient/family education;Manual techniques;Passive range of motion;Energy conservation   PT Next Visit Plan HEP   Consulted and Agree with Plan of Care Patient        Problem List Patient Active Problem List   Diagnosis Date Noted  . Degenerative joint disease (DJD) of hip 10/09/2014  . S/P total hip arthroplasty 10/09/2014   Monica [name], PT, DPT [phone]  Monica [name] 06/03/2015, 2:22 PM  [place] [facility] MAIN [facility] SERVICES [address] [place], [place], [address] Phone: [phone]   Fax:  [phone]  Name: Monica [name] MRN: [id number] Date of Birth: 1940-12-16

## 2015-06-05 ENCOUNTER — Ambulatory Visit: Payer: Medicare Other | Attending: Otolaryngology

## 2015-06-05 ENCOUNTER — Ambulatory Visit: Payer: Medicare Other

## 2015-06-05 DIAGNOSIS — R531 Weakness: Secondary | ICD-10-CM

## 2015-06-05 DIAGNOSIS — R2681 Unsteadiness on feet: Secondary | ICD-10-CM | POA: Diagnosis not present

## 2015-06-05 NOTE — Therapy (Signed)
[place] [facility] MAIN [facility] SERVICES [address] [place], [place], [address] Phone: [phone]   Fax:  [phone]  Physical Therapy Treatment  Patient Details  Name: Monica [name] MRN: [id number] Date of Birth: 08-Jul-1940 Referring Provider: Dr Bud Face  Encounter Date: 06/05/2015      PT End of Session - 06/05/15 1354    Visit Number 5   Number of Visits 9   Date for PT Re-Evaluation 06/17/15   Authorization Type 5/10   PT Start Time 1115   PT Stop Time 1159   PT Time Calculation (min) 44 min   Equipment Utilized During Treatment Gait belt   Activity Tolerance Patient limited by fatigue;Patient tolerated treatment well   Behavior During Therapy [facility] for tasks assessed/performed;Anxious      Past Medical History  Diagnosis Date  . Arthritis   . Complication of anesthesia     woke up agitated following hernia surgery 02/2014  . Anxiety   . GERD (gastroesophageal reflux disease)     Past Surgical History  Procedure Laterality Date  . Hernia repair    . 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  Visit Diagnosis:  Unsteadiness  Generalized weakness      Subjective Assessment - 06/05/15 1353    Subjective pt reports LE soreness after last session, but it is to be expected after activity changes.    Patient Stated Goals pick up something up off floor, reach her shoes, feel more confident moving   Currently in Pain? Yes   Pain Score 2    Pain Location --  L lateral knee       Therex: Nustep: L 4 x 4 min warm up Leg press 80lbs BLE 2x10 Fwd step  Up onto 6inch step single hand rail x 10 each leg IT band roll with stick 30 s x 3 to LLE Pt requires min verbal and tactile cues for proper exercise performance   NMR : 4 square step x 10  CW/CCW Walking in clinc x 70m slow / fast x 4 laps Walking in clinic x 60m stop/go x 4 laps Walking in clinic x 40 m fwd/retro x 4 laps  pt requires CGA for safety on balance exercises  Cues to walk more quickly                           PT Education - 06/05/15 1354    Education provided Yes   Education Details self STM for ITB   Person(s) Educated Patient   Methods Explanation   Comprehension Verbalized understanding             PT Long Term Goals - 05/20/15 1259    PT LONG TERM GOAL #1   Title pt will improve Berg Balance score to >45 to decrease fall risk    Baseline 38   Time 4   Period Weeks   Status New   PT LONG TERM GOAL #2   Title pt will improve 5xSTS to 30 seconds to demonstrate improved strength   Baseline 63 sec   Time 4   Period Weeks   Status New   PT LONG TERM GOAL #3   Title pt will improve 7m walk speed to 0.8 m/s for safe household  mobility   Baseline 0.5 m/s   Time 4   Period Weeks   Status New   PT LONG TERM GOAL #4   Title pt will improved TUG time to <20 sec for independent mobile   Baseline 31.47   Time 4   Period Weeks   Status New   PT LONG TERM GOAL #5   Title pt will be able to ascend and decend 3 stairs in step over step pattern in order to independently enter home   Time 4   Period Weeks   Status New               Plan - 06/05/15 1354    Clinical Impression Statement pt did well with NMR today including walking activities, she needs cues to try new activities or to push herself hard, but is doing well despite her anxiety.    Pt will benefit from skilled therapeutic intervention in order to improve on the following deficits Abnormal gait;Decreased activity tolerance;Decreased balance;Decreased endurance;Decreased strength;Dizziness   Rehab Potential Poor   PT Frequency 2x / week   PT Duration 4 weeks   PT Treatment/Interventions ADLs/Self Care Home Management;Electrical  Stimulation;Cryotherapy;Traction;Ultrasound;Gait training;Stair training;Functional mobility training;Therapeutic exercise;Balance training;Neuromuscular re-education;Patient/family education;Manual techniques;Passive range of motion;Energy conservation   PT Next Visit Plan HEP   Consulted and Agree with Plan of Care Patient        Problem List Patient Active Problem List   Diagnosis Date Noted  . Degenerative joint disease (DJD) of hip 10/09/2014  . S/P total hip arthroplasty 10/09/2014   [name]. Ranger Petrich, PT, DPT [phone]  Allizon Woznick 06/05/2015, 1:55 PM  [place] [facility] MAIN [facility] SERVICES [address] [place], [place], [address] Phone: [phone]   Fax:  [phone]  Name: Rayni Nemitz MRN: [id number] Date of Birth: Mar 31, 1941

## 2015-06-09 ENCOUNTER — Ambulatory Visit: Payer: Medicare Other

## 2015-06-09 DIAGNOSIS — R2681 Unsteadiness on feet: Secondary | ICD-10-CM

## 2015-06-09 DIAGNOSIS — R531 Weakness: Secondary | ICD-10-CM

## 2015-06-09 NOTE — Therapy (Signed)
[place] [facility] MAIN [facility] SERVICES [address] [place], [place], [address] Phone: [phone]   Fax:  [phone]  Physical Therapy Treatment  Patient Details  Name: Monica [name] MRN: [id number] Date of Birth: 07/14/1940 Referring Provider: Dr Bud Face  Encounter Date: 06/09/2015      PT End of Session - 06/09/15 1430    Visit Number 7   Number of Visits 9   Date for PT Re-Evaluation 06/17/15   Authorization Type 7/10   PT Start Time 1340   PT Stop Time 1430   PT Time Calculation (min) 50 min   Equipment Utilized During Treatment Gait belt   Activity Tolerance Patient limited by fatigue;Patient tolerated treatment well   Behavior During Therapy [facility] for tasks assessed/performed;Anxious      Past Medical History  Diagnosis Date  . Arthritis   . Complication of anesthesia     woke up agitated following hernia surgery 02/2014  . Anxiety   . GERD (gastroesophageal reflux disease)     Past Surgical History  Procedure Laterality Date  . Hernia repair    . 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  Visit Diagnosis:  Unsteadiness  Generalized weakness      Subjective Assessment - 06/09/15 1356    Subjective pt reports she is a litlte sore today in the LLE   Patient Stated Goals pick up something up off floor, reach her shoes, feel more confident moving   Currently in Pain? No/denies         Therex:  Nustep : L 5x 5 min  Leg press: 75lbs 2x12 Fwd/retro/side lunge x10 each, BLE,  Cone pick up - first from elevated surface x 3 in, then progressed to on ground. Cues for wide stance and knee flexion. X 10 min Standing on [employer] EO/EC 20s x 5 Standing on [employer] weight shifting 1 min x 3 AP/SS wide BOS Pt requires min verbal and tactile cues for proper  exercise performance    pt requires CGA to min A for safety on balance exercises                               PT Long Term Goals - 05/20/15 1259    PT LONG TERM GOAL #1   Title pt will improve Berg Balance score to >45 to decrease fall risk    Baseline 38   Time 4   Period Weeks   Status New   PT LONG TERM GOAL #2   Title pt will improve 5xSTS to 30 seconds to demonstrate improved strength   Baseline 63 sec   Time 4   Period Weeks   Status New   PT LONG TERM GOAL #3   Title pt will improve 30m walk speed to 0.8 m/s for safe household mobility   Baseline 0.5 m/s   Time 4   Period Weeks   Status New   PT LONG TERM GOAL #4   Title pt will improved TUG time to <20 sec for independent mobile   Baseline 31.47   Time 4   Period Weeks   Status New   PT LONG TERM GOAL #5   Title pt will be able to ascend and decend 3 stairs in  step over step pattern in order to independently enter home   Time 4   Period Weeks   Status New               Plan - 06/09/15 1430    Clinical Impression Statement pt continues to make progress with her exercises and is tolerating progression of PT exercises well in each session. pt was able to pick up a cone from the ground today multiple times which was one of her main goals.    Pt will benefit from skilled therapeutic intervention in order to improve on the following deficits Abnormal gait;Decreased activity tolerance;Decreased balance;Decreased endurance;Decreased strength;Dizziness   Rehab Potential Poor   PT Frequency 2x / week   PT Duration 4 weeks   PT Treatment/Interventions ADLs/Self Care Home Management;Electrical Stimulation;Cryotherapy;Traction;Ultrasound;Gait training;Stair training;Functional mobility training;Therapeutic exercise;Balance training;Neuromuscular re-education;Patient/family education;Manual techniques;Passive range of motion;Energy conservation   PT Next Visit Plan HEP   Consulted and Agree with  Plan of Care Patient        Problem List Patient Active Problem List   Diagnosis Date Noted  . Degenerative joint disease (DJD) of hip 10/09/2014  . S/P total hip arthroplasty 10/09/2014   Carlyon ShadowAshley C. Destyne Goodreau, PT, DPT [phone]  Augusta Mirkin 06/09/2015, 2:32 PM  [place] [facility] [address] [place], [place], [address] Phone: [phone]   Fax:  [phone]  Name: Lonna DuvalJoan Earnhart MRN: [id number] Date of Birth: 16-Apr-1940

## 2015-06-11 ENCOUNTER — Ambulatory Visit: Payer: Medicare Other

## 2015-06-11 DIAGNOSIS — R2681 Unsteadiness on feet: Secondary | ICD-10-CM | POA: Diagnosis not present

## 2015-06-11 DIAGNOSIS — R531 Weakness: Secondary | ICD-10-CM

## 2015-06-11 NOTE — Therapy (Signed)
[facility] MAIN [facility] SERVICES [address] [place], [place], [address] Phone: [phone]   Fax:  [phone]  Physical Therapy Treatment  Patient Details  Name: Monica [name] MRN: [id number] Date of Birth: 1941-04-01 Referring Provider: Dr Bud Face  Encounter Date: 06/11/2015      PT End of Session - 06/11/15 1517    Visit Number 8   Number of Visits 9   Date for PT Re-Evaluation 06/17/15   Authorization Type 8/10   PT Start Time 1345   PT Stop Time 1430   PT Time Calculation (min) 45 min   Equipment Utilized During Treatment Gait belt   Activity Tolerance Patient limited by fatigue;Patient tolerated treatment well   Behavior During Therapy [facility] for tasks assessed/performed;Anxious      Past Medical History  Diagnosis Date  . Arthritis   . Complication of anesthesia     woke up agitated following hernia surgery 02/2014  . Anxiety   . GERD (gastroesophageal reflux disease)     Past Surgical History  Procedure Laterality Date  . Hernia repair    . 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  Visit Diagnosis:  Unsteadiness  Generalized weakness      Subjective Assessment - 06/11/15 1516    Subjective pt reports no new falls. she still uses her cane because she is anxious   Patient Stated Goals pick up something up off floor, reach her shoes, feel more confident moving   Currently in Pain? No/denies     therex: Nustep L 4 x 5 min no charge Leg press 90lbs 3x10 Resisted walking 7.5lbs fwd/retro x 5 laps cues for inc. Step length Resisted walking 5lbs side to side x 3 laps L/R , pt had more difficulty stepping L Full squat with mat behind for safety x 10  pt requires CGA for safety on balance exercises                               PT Education - 06/11/15 1516    Education provided Yes   Education Details recommended home STM of L IT band using rolling pin   Person(s) Educated Patient   Methods Explanation   Comprehension Verbalized understanding             PT Long Term Goals - 05/20/15 1259    PT LONG TERM GOAL #1   Title pt will improve Berg Balance score to >45 to decrease fall risk    Baseline 38   Time 4   Period Weeks   Status New   PT LONG TERM GOAL #2   Title pt will improve 5xSTS to 30 seconds to demonstrate improved strength   Baseline 63 sec   Time 4   Period Weeks   Status New   PT LONG TERM GOAL #3   Title pt will improve 30m walk speed to 0.8 m/s for safe household mobility   Baseline 0.5 m/s   Time 4   Period Weeks   Status New   PT LONG TERM GOAL #4   Title pt will improved TUG time to <20 sec for independent mobile   Baseline 31.47   Time 4   Period Weeks   Status New  PT LONG TERM GOAL #5   Title pt will be able to ascend and decend 3 stairs in step over step pattern in order to independently enter home   Time 4   Period Weeks   Status New               Plan - 06/11/15 1517    Clinical Impression Statement pt did well with progression of activity today. she had quite a bit of challenge with resisted walking. she had some L knee pain with side stepping, which was relieved with STM using the stick to the IT band.    Pt will benefit from skilled therapeutic intervention in order to improve on the following deficits Abnormal gait;Decreased activity tolerance;Decreased balance;Decreased endurance;Decreased strength;Dizziness   Rehab Potential Poor   PT Frequency 2x / week   PT Duration 4 weeks   PT Treatment/Interventions ADLs/Self Care Home Management;Electrical Stimulation;Cryotherapy;Traction;Ultrasound;Gait training;Stair training;Functional mobility training;Therapeutic exercise;Balance training;Neuromuscular  re-education;Patient/family education;Manual techniques;Passive range of motion;Energy conservation   PT Next Visit Plan progressnote   Consulted and Agree with Plan of Care Patient        Problem List Patient Active Problem List   Diagnosis Date Noted  . Degenerative joint disease (DJD) of hip 10/09/2014  . S/P total hip arthroplasty 10/09/2014   [name], PT, DPT [phone]  [name] 06/11/2015, 3:19 PM  [place] [facility] [address] [place], [place], [address] Phone: [phone]   Fax:  [phone]  Name: Monica DuvalJoan [name] MRN: [id number] Date of Birth: 12-Sep-1940

## 2015-06-16 ENCOUNTER — Ambulatory Visit: Payer: Medicare Other

## 2015-06-16 DIAGNOSIS — R2681 Unsteadiness on feet: Secondary | ICD-10-CM

## 2015-06-16 DIAGNOSIS — R531 Weakness: Secondary | ICD-10-CM

## 2015-06-16 NOTE — Therapy (Signed)
[place] MAIN [facility] SERVICES [address] [place], [place], [address] Phone: [phone]   Fax:  [phone]  Physical Therapy Treatment /Physical Therapy Progress Note 05/19/15 to 06/16/15  Patient Details  Name: Monica [name] MRN: [id number] Date of Birth: 09-Oct-1940 Referring Provider: Dr Carloyn Manner  Encounter Date: 06/16/2015      PT End of Session - 06/16/15 1425    Visit Number 9   Number of Visits 17   Date for PT Re-Evaluation 06/17/15   Authorization Type 1/10   PT Start Time 1345   PT Stop Time 1426   PT Time Calculation (min) 41 min   Equipment Utilized During Treatment Gait belt   Activity Tolerance Patient limited by fatigue;Patient tolerated treatment well   Behavior During Therapy [facility] for tasks assessed/performed;Anxious      Past Medical History  Diagnosis Date  . Arthritis   . Complication of anesthesia     woke up agitated following hernia surgery 02/2014  . Anxiety   . GERD (gastroesophageal reflux disease)     Past Surgical History  Procedure Laterality Date  . Hernia repair    . 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  Visit Diagnosis:  Unsteadiness - Plan: PT plan of care cert/re-cert  Generalized weakness - Plan: PT plan of care cert/re-cert      Subjective Assessment - 06/16/15 1425    Subjective pt reports she feels she has made a great deal of progress.    Patient Stated Goals pick up something up off floor, reach her shoes, feel more confident moving   Currently in Pain? No/denies      Therex: PT re-assessed outcome measure and progress towards goals as follows:      [facility] PT Assessment - 06/16/15 1359    Standardized Balance Assessment   Standardized Balance Assessment Berg Balance Test;Timed Up and Go  Test;Five Times Sit to Stand;10 meter walk test   Five times sit to stand comments  30s   10 Meter Walk 0.74ms   Berg Balance Test   Sit to Stand Able to stand without using hands and stabilize independently   Standing Unsupported Able to stand safely 2 minutes   Sitting with Back Unsupported but Feet Supported on Floor or Stool Able to sit safely and securely 2 minutes   Stand to Sit Sits safely with minimal use of hands   Transfers Able to transfer safely, minor use of hands   Standing Unsupported with Eyes Closed Able to stand 10 seconds safely   Standing Ubsupported with Feet Together Able to place feet together independently and stand 1 minute safely   From Standing, Reach Forward with Outstretched Arm Can reach forward >12 cm safely (5")   From Standing Position, Pick up Object from Floor Able to pick up shoe, needs supervision   From Standing Position, Turn to Look Behind Over each Shoulder Looks behind one side only/other side shows less weight shift   Turn 360 Degrees Able to turn 360 degrees safely but slowly   Standing Unsupported, Alternately Place Feet on Step/Stool Able to stand independently and complete 8 steps >20 seconds   Standing Unsupported, One Foot in Front Able to take small step independently and hold 30 seconds   Standing on One Leg Tries to lift leg/unable  to hold 3 seconds but remains standing independently   Total Score 45   Timed Up and Go Test   Normal TUG (seconds) 18.5                             PT Education - 06/16/15 1425    Education provided Yes   Education Details plan of care, progress    Person(s) Educated Patient   Methods Explanation   Comprehension Verbalized understanding             PT Long Term Goals - 06/16/15 1427    PT LONG TERM GOAL #1   Title pt will improve Berg Balance score to >45 to decrease fall risk    Baseline 45/56   Time 4   Period Weeks   Status Partially Met   PT LONG TERM GOAL #2   Title  pt will improve 5xSTS to 30 seconds to demonstrate improved strength   Baseline 30   Time 4   Period Weeks   Status Achieved   PT LONG TERM GOAL #3   Title pt will improve 64mwalk speed to 0.8 m/s for safe household mobility   Baseline 0.5 m/s   Time 4   Period Weeks   Status Achieved   PT LONG TERM GOAL #4   Title pt will improved TUG time to <20 sec for independent mobile   Baseline 31.47   Time 4   Period Weeks   Status Achieved   PT LONG TERM GOAL #5   Title pt will be able to ascend and decend 3 stairs in step over step pattern in order to independently enter home   Time 4   Period Weeks   Status Partially Met   Additional Long Term Goals   Additional Long Term Goals Yes   PT LONG TERM GOAL #6   Title pt will increase 6 min walk by 1017fto improve community mobility.    Time 4   Period Weeks   Status New   PT LONG TERM GOAL #7   Title pt will perform 5x sit to stand in <25s demonstrating improved LE strength    Time 4   Period Weeks   Status New   PT LONG TERM GOAL #8   Title pt will improve Berg to >51/56 demonstrating reduced fall risk .   Time 4   Period Weeks   Status New               Plan - 06/16/15 1426    Clinical Impression Statement pt has made good progress towards meeting her PT goals and has made significant progress regarding outcome meaures. she has made significant progress with her balance, gait, strength and mobility. pt still has quite a bit of anxiety regarding walking at a more normal pace, ascending/descending steps and her balance. pt would benefit from continued skilled PT services to further progress balance, strength, gait and mobility.    Pt will benefit from skilled therapeutic intervention in order to improve on the following deficits Abnormal gait;Decreased activity tolerance;Decreased balance;Decreased endurance;Decreased strength;Dizziness   Rehab Potential Good   PT Frequency 2x / week   PT Duration 4 weeks   PT  Treatment/Interventions ADLs/Self Care Home Management;Electrical Stimulation;Cryotherapy;Traction;Ultrasound;Gait training;Stair training;Functional mobility training;Therapeutic exercise;Balance training;Neuromuscular re-education;Patient/family education;Manual techniques;Passive range of motion;Energy conservation   PT Next Visit Plan progressnote   Consulted and Agree with Plan of Care Patient  G-Codes - 06/16/15 1430    Functional Assessment Tool Used 5xsittostand/berg/35malk   Functional Limitation Mobility: Walking and moving around   Mobility: Walking and Moving Around Current Status [phone] At least 20 percent but less than 40 percent impaired, limited or restricted   Mobility: Walking and Moving Around Goal Status [phone] At least 1 percent but less than 20 percent impaired, limited or restricted      Problem List Patient Active Problem List   Diagnosis Date Noted  . Degenerative joint disease (DJD) of hip 10/09/2014  . S/P total hip arthroplasty 10/09/2014   AGorden Harms Monica [name], PT, DPT #[phone] Aikam Hellickson 06/16/2015, 2:31 PM  [place]MAIN [facility] [address] [place] [address]Phone: [phone]  Fax:  [phone] Name: JRendy LazardMRN: [id number] of Birth: [birth year]

## 2015-06-18 ENCOUNTER — Ambulatory Visit: Payer: Medicare Other

## 2015-06-18 DIAGNOSIS — R2681 Unsteadiness on feet: Secondary | ICD-10-CM

## 2015-06-18 DIAGNOSIS — R531 Weakness: Secondary | ICD-10-CM

## 2015-06-18 NOTE — Therapy (Signed)
[place] MAIN [facility] SERVICES [address] [place], [place], [address] Phone: [phone]   Fax:  [phone]  Physical Therapy Treatment  Patient Details  Name: Monica [name] MRN: [id number] Date of Birth: [birth year] Referring Provider: Dr Carloyn Manner  Encounter Date: 06/18/2015      PT End of Session - 06/18/15 1425    Visit Number 10   Number of Visits 17   Date for PT Re-Evaluation 06/17/15   Authorization Type 2/10   PT Start Time 8768   PT Stop Time 1430   PT Time Calculation (min) 45 min   Equipment Utilized During Treatment Gait belt   Activity Tolerance Patient limited by fatigue;Patient tolerated treatment well   Behavior During Therapy [facility] for tasks assessed/performed;Anxious      Past Medical History  Diagnosis Date  . Arthritis   . Complication of anesthesia     woke up agitated following hernia surgery 02/2014  . Anxiety   . GERD (gastroesophageal reflux disease)     Past Surgical History  Procedure Laterality Date  . Hernia repair    . 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  Visit Diagnosis:  Unsteadiness  Generalized weakness      Subjective Assessment - 06/18/15 1354    Subjective (p) pt reports she is ready to work hard   Pertinent History (p) R THA, L knee OA, anxiety    Patient Stated Goals (p) pick up something up off floor, reach her shoes, feel more confident moving   Currently in Pain? (p) Yes   Pain Score (p) 0-No pain        Therex: Nustep no charge x 5 min L 5 74mn walk: [id number] with 2lb weights 2x10 Fwd lunge on BOSU x 10 each leg Side and retro lunge on floor x 10 each leg  Fwd step up 2x 10 each leg 6inch step   Pt requires min verbal and tactile cues for proper exercise performance                               PT Long Term Goals - 06/16/15 1427    PT LONG TERM GOAL #1   Title pt will improve Berg Balance score to >45 to decrease fall risk    Baseline 45/56   Time 4   Period Weeks   Status Partially Met   PT LONG TERM GOAL #2   Title pt will improve 5xSTS to 30 seconds to demonstrate improved strength   Baseline 30   Time 4   Period Weeks   Status Achieved   PT LONG TERM GOAL #3   Title pt will improve 1041mlk speed to 0.8 m/s for safe household mobility   Baseline 0.5 m/s   Time 4   Period Weeks   Status Achieved   PT LONG TERM GOAL #4   Title pt will improved TUG time to <20 sec for independent mobile   Baseline 31.47   Time 4   Period Weeks   Status Achieved   PT LONG TERM GOAL #5   Title pt will be able to ascend and decend 3 stairs in step over step pattern in order to independently enter home   Time 4  Period Weeks   Status Partially Met   Additional Long Term Goals   Additional Long Term Goals Yes   PT LONG TERM GOAL #6   Title pt will increase 6 min walk by 167f to improve community mobility.    Time 4   Period Weeks   Status New   PT LONG TERM GOAL #7   Title pt will perform 5x sit to stand in <25s demonstrating improved LE strength    Time 4   Period Weeks   Status New   PT LONG TERM GOAL #8   Title pt will improve Berg to >51/56 demonstrating reduced fall risk .   Time 4   Period Weeks   Status New               Plan - 06/18/15 1425    Clinical Impression Statement continues to show progressing motivation and progress in therapy. PT initiated progerssive walking program today to work on endurance, pt is in aggreance with this.    Pt will benefit from skilled therapeutic intervention in order to improve on the following deficits Abnormal gait;Decreased activity tolerance;Decreased balance;Decreased endurance;Decreased strength;Dizziness   Rehab Potential Good   PT Frequency 2x / week   PT  Duration 4 weeks   PT Treatment/Interventions ADLs/Self Care Home Management;Electrical Stimulation;Cryotherapy;Traction;Ultrasound;Gait training;Stair training;Functional mobility training;Therapeutic exercise;Balance training;Neuromuscular re-education;Patient/family education;Manual techniques;Passive range of motion;Energy conservation   PT Next Visit Plan progressnote   Consulted and Agree with Plan of Care Patient        Problem List Patient Active Problem List   Diagnosis Date Noted  . Degenerative joint disease (DJD) of hip 10/09/2014  . S/P total hip arthroplasty 10/09/2014   AGorden Harms Monica [name], PT, DPT #[id number] Monica [name] 06/18/2015, 2:28 PM  [place]MAIN [facility] [address] [place] [address]Phone: [phone]  Fax:  [phone] Name: Monica LisenbyMRN: [id number] of Birth: 101-01-1941

## 2015-06-18 NOTE — Patient Instructions (Signed)
WALKING  Walking is a great form of exercise to increase your strength, endurance and overall fitness.  A walking program can help you start slowly and gradually build endurance as you go.  Everyone's ability is different, so each person's starting point will be different.  You do not have to follow them exactly.  The are just samples. You should simply find out what's right for you and stick to that program.   In the beginning, you'll start off walking 2-3 times a day for short distances.  As you get stronger, you'll be walking further at just 1-2 times per day.  A. You Can Walk For A Certain Length Of Time Each Day    Walk 5 minutes 3 times per day.  Increase 2 minutes every 2 days (3 times per day).  Work up to 25-30 minutes (1-2 times per day).   Example:   Day 1-2 5 minutes 3 times per day   Day 7-8 12 minutes 2-3 times per day   Day 13-14 25 minutes 1-2 times per day  B. You Can Walk For a Certain Distance Each Day     Distance can be substituted for time.    Example:   3 trips to mailbox (at road)   3 trips to corner of block   3 trips around the block   

## 2015-06-23 ENCOUNTER — Ambulatory Visit: Payer: Medicare Other

## 2015-06-23 DIAGNOSIS — R2681 Unsteadiness on feet: Secondary | ICD-10-CM

## 2015-06-23 DIAGNOSIS — R531 Weakness: Secondary | ICD-10-CM

## 2015-06-23 NOTE — Therapy (Signed)
[place] MAIN [facility] SERVICES [address] [place], [place], [address] Phone: [phone]   Fax:  [phone]  Physical Therapy Treatment  Patient Details  Name: Monica [name] MRN: [id number] Date of Birth: 1940/05/10 Referring Provider: Dr Carloyn Manner  Encounter Date: 06/23/2015      PT End of Session - 06/23/15 1705    Visit Number 11   Number of Visits 17   Date for PT Re-Evaluation 06/17/15   Authorization Type 3/10   PT Start Time 6606   PT Stop Time 1657   PT Time Calculation (min) 42 min   Equipment Utilized During Treatment Gait belt   Activity Tolerance Patient limited by fatigue;Patient tolerated treatment well   Behavior During Therapy [facility] for tasks assessed/performed;Anxious      Past Medical History  Diagnosis Date  . Arthritis   . Complication of anesthesia     woke up agitated following hernia surgery 02/2014  . Anxiety   . GERD (gastroesophageal reflux disease)     Past Surgical History  Procedure Laterality Date  . Hernia repair    . 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  Visit Diagnosis:  Unsteadiness  Generalized weakness      Subjective Assessment - 06/23/15 1704    Subjective pt reports she went up to her bonus room today, but she did 2 feet per step due to being scared.    Pertinent History R THA, L knee OA, anxiety    Patient Stated Goals pick up something up off floor, reach her shoes, feel more confident moving   Currently in Pain? No/denies      Therex:  Ascending and descending steps in step over step pattern using unilateral handrail- first on 6inch steps x 5 laps then progressed to 8inch steps in stair well x 8 stairs x 6 laps Pt requires min verbal and tactile cues for proper exercise performance   Fwd and  side lunge with resistance in cable column 7.5lbs x 10 fwd and x 10 each side bilaterally (side lunge)  pt requires CGA for safety on balance exercises  In hallway walking with cues for increased step length and speed with ball toss 62f x 8  pt requires CGA for safety on balance exercises                             PT Education - 06/23/15 1705    Education provided Yes   Education Details stair training, step over step pattern using 1 handrail   Person(s) Educated Patient   Methods Explanation   Comprehension Verbalized understanding             PT Long Term Goals - 06/16/15 1427    PT LONG TERM GOAL #1   Title pt will improve Berg Balance score to >45 to decrease fall risk    Baseline 45/56   Time 4   Period Weeks   Status Partially Met   PT LONG TERM GOAL #2   Title pt will improve 5xSTS to 30 seconds to demonstrate improved strength   Baseline 30   Time 4   Period Weeks   Status Achieved   PT LONG TERM GOAL #3   Title pt will improve 143malk  speed to 0.8 m/s for safe household mobility   Baseline 0.5 m/s   Time 4   Period Weeks   Status Achieved   PT LONG TERM GOAL #4   Title pt will improved TUG time to <20 sec for independent mobile   Baseline 31.47   Time 4   Period Weeks   Status Achieved   PT LONG TERM GOAL #5   Title pt will be able to ascend and decend 3 stairs in step over step pattern in order to independently enter home   Time 4   Period Weeks   Status Partially Met   Additional Long Term Goals   Additional Long Term Goals Yes   PT LONG TERM GOAL #6   Title pt will increase 6 min walk by 155f to improve community mobility.    Time 4   Period Weeks   Status New   PT LONG TERM GOAL #7   Title pt will perform 5x sit to stand in <25s demonstrating improved LE strength    Time 4   Period Weeks   Status New   PT LONG TERM GOAL #8   Title pt will improve Berg to >51/56 demonstrating reduced fall risk .   Time 4   Period  Weeks   Status New               Plan - 06/23/15 1706    Clinical Impression Statement pt did well with progression of activities today. she was unconfortable walking with the ball toss as she wants to look at her feet, needing motivation to continue.    Pt will benefit from skilled therapeutic intervention in order to improve on the following deficits Abnormal gait;Decreased activity tolerance;Decreased balance;Decreased endurance;Decreased strength;Dizziness   Rehab Potential Good   PT Frequency 2x / week   PT Duration 4 weeks   PT Treatment/Interventions ADLs/Self Care Home Management;Electrical Stimulation;Cryotherapy;Traction;Ultrasound;Gait training;Stair training;Functional mobility training;Therapeutic exercise;Balance training;Neuromuscular re-education;Patient/family education;Manual techniques;Passive range of motion;Energy conservation   PT Next Visit Plan progressnote   Consulted and Agree with Plan of Care Patient        Problem List Patient Active Problem List   Diagnosis Date Noted  . Degenerative joint disease (DJD) of hip 10/09/2014  . S/P total hip arthroplasty 10/09/2014   AGorden Harms Hilary Milks, PT, DPT #[phone] Avagail Whittlesey 06/23/2015, 5:07 PM  CM[place]MAIN ROak[facility] HospitalSERVICES [address] [address]Phone: [phone]  Fax:  [phone] Name: JWilmetta SpeiserMRN: [id number] of Birth: [birth year]

## 2015-07-02 ENCOUNTER — Ambulatory Visit: Payer: Medicare Other

## 2015-07-02 DIAGNOSIS — R2681 Unsteadiness on feet: Secondary | ICD-10-CM

## 2015-07-02 DIAGNOSIS — R531 Weakness: Secondary | ICD-10-CM

## 2015-07-03 NOTE — Therapy (Signed)
[place] MAIN [facility] SERVICES [address] [place], [place], [address] Phone: [phone]   Fax:  [phone]  Physical Therapy Treatment  Patient Details  Name: Monica [name] MRN: [id number] Date of Birth: 07/04/1940 Referring Provider: Dr Carloyn Manner  Encounter Date: 07/02/2015      PT End of Session - 07/03/15 0847    Visit Number 12   Number of Visits 17   Date for PT Re-Evaluation 07/15/15   Authorization Type 4/10   PT Start Time 1615   PT Stop Time 1700   PT Time Calculation (min) 45 min   Equipment Utilized During Treatment Gait belt   Activity Tolerance Patient limited by fatigue;Patient tolerated treatment well   Behavior During Therapy [facility] for tasks assessed/performed;Anxious      Past Medical History  Diagnosis Date  . Arthritis   . Complication of anesthesia     woke up agitated following hernia surgery 02/2014  . Anxiety   . GERD (gastroesophageal reflux disease)     Past Surgical History  Procedure Laterality Date  . Hernia repair    . 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  Visit Diagnosis:  Unsteadiness  Generalized weakness      Subjective Assessment - 07/03/15 0847    Subjective pt reports she is up to 10 min on her walking program   Pertinent History R THA, L knee OA, anxiety    Patient Stated Goals pick up something up off floor, reach her shoes, feel more confident moving   Currently in Pain? No/denies       Nustep: L 4 x 4 min no charge Resisted walking sideways in CC: 7.5lbs x 3 laps each Resisted fwd lunge 7.5lbs x 10 each side In hallway 14f x 4 with ball toss fwd walking In hallway 826fx 6 with cues to walk slow/fast/retro and stop Heel/toe tandem walk 1024f 5 laps  pt requires CGA -min A for safety on  balance exercises                            PT Education - 07/03/15 0847    Education provided Yes   Education Details upward gaze during ambulation   Person(s) Educated Patient   Methods Explanation   Comprehension Verbalized understanding             PT Long Term Goals - 06/16/15 1427    PT LONG TERM GOAL #1   Title pt will improve Berg Balance score to >45 to decrease fall risk    Baseline 45/56   Time 4   Period Weeks   Status Partially Met   PT LONG TERM GOAL #2   Title pt will improve 5xSTS to 30 seconds to demonstrate improved strength   Baseline 30   Time 4   Period Weeks   Status Achieved   PT LONG TERM GOAL #3   Title pt will improve 42m61mk speed to 0.8 m/s for safe household mobility   Baseline 0.5 m/s   Time 4   Period Weeks   Status Achieved   PT LONG TERM GOAL #4   Title pt will improved TUG time to <20 sec for independent mobile   Baseline 31.47   Time 4  Period Weeks   Status Achieved   PT LONG TERM GOAL #5   Title pt will be able to ascend and decend 3 stairs in step over step pattern in order to independently enter home   Time 4   Period Weeks   Status Partially Met   Additional Long Term Goals   Additional Long Term Goals Yes   PT LONG TERM GOAL #6   Title pt will increase 6 min walk by 134f to improve community mobility.    Time 4   Period Weeks   Status New   PT LONG TERM GOAL #7   Title pt will perform 5x sit to stand in <25s demonstrating improved LE strength    Time 4   Period Weeks   Status New   PT LONG TERM GOAL #8   Title pt will improve Berg to >51/56 demonstrating reduced fall risk .   Time 4   Period Weeks   Status New               Plan - 07/03/15 0848    Clinical Impression Statement pt continues to do well with progression of gait, balance and strength training. she still has anxiety before each exercise, but has improved confidence after encouragement and through performing the  exercises.    Pt will benefit from skilled therapeutic intervention in order to improve on the following deficits Abnormal gait;Decreased activity tolerance;Decreased balance;Decreased endurance;Decreased strength;Dizziness   Rehab Potential Good   PT Frequency 2x / week   PT Duration 4 weeks   PT Treatment/Interventions ADLs/Self Care Home Management;Electrical Stimulation;Cryotherapy;Traction;Ultrasound;Gait training;Stair training;Functional mobility training;Therapeutic exercise;Balance training;Neuromuscular re-education;Patient/family education;Manual techniques;Passive range of motion;Energy conservation   PT Next Visit Plan progressnote   Consulted and Agree with Plan of Care Patient        Problem List Patient Active Problem List   Diagnosis Date Noted  . Degenerative joint disease (DJD) of hip 10/09/2014  . S/P total hip arthroplasty 10/09/2014    Keali Mccraw 07/03/2015, 8:49 AM  CCarnelian BayMAIN RSt. Vincent [facility] CenterSERVICES [address]RWent[address] [place] [address]Phone: [phone]  Fax:  [phone] Name: JSumiye HirthMRN: [id number] of Birth: 1Mar 28, 1942

## 2015-07-07 ENCOUNTER — Ambulatory Visit: Payer: Medicare Other | Attending: Otolaryngology

## 2015-07-07 DIAGNOSIS — R531 Weakness: Secondary | ICD-10-CM

## 2015-07-07 DIAGNOSIS — M6281 Muscle weakness (generalized): Secondary | ICD-10-CM | POA: Insufficient documentation

## 2015-07-07 DIAGNOSIS — R2681 Unsteadiness on feet: Secondary | ICD-10-CM | POA: Diagnosis present

## 2015-07-07 NOTE — Therapy (Signed)
[place] MAIN [facility] SERVICES [address] [place], [place], [address] Phone: [phone]   Fax:  [phone]  Physical Therapy Treatment  Patient Details  Name: Monica [name] MRN: [id number] Date of Birth: May 16, 1940 Referring Provider: Dr Carloyn Manner  Encounter Date: 07/07/2015      PT End of Session - 07/07/15 1532    Visit Number 13   Number of Visits 17   Date for PT Re-Evaluation 07/15/15   Authorization Type 5/10   PT Start Time 1115   PT Stop Time 1200   PT Time Calculation (min) 45 min   Equipment Utilized During Treatment Gait belt   Activity Tolerance Patient limited by fatigue;Patient tolerated treatment well   Behavior During Therapy [facility] for tasks assessed/performed;Anxious      Past Medical History  Diagnosis Date  . Arthritis   . Complication of anesthesia     woke up agitated following hernia surgery 02/2014  . Anxiety   . GERD (gastroesophageal reflux disease)     Past Surgical History  Procedure Laterality Date  . Hernia repair    . 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  Visit Diagnosis:  Generalized weakness  Unsteadiness      Subjective Assessment - 07/07/15 1531    Subjective pt reports she has been walking more without her cane    Pertinent History R THA, L knee OA, anxiety    Patient Stated Goals pick up something up off floor, reach her shoes, feel more confident moving   Currently in Pain? No/denies       Therex: Nustep L 3 x 5 min no charge Sit to stand from chair onto AIREX 1x5 eyes straight, 1x3 L / R head turned  IT roll with "the stick" x 1 min Pt requires min verbal and tactile cues for proper exercise performance   NMR:  Ladder drills x 15 min various patterns with min cues for sequencing and cues to  increase speed  Standing on AIREX bending down to pick up foam  Balls out of bin then throwing into basket x 20, cues for proper squat, encouragement and cues to increase speed of squat to stand to minimize fatigue.  Heel/ toe walk in // bars cues to not use UEs fwd/ retro x 3 laps  pt requires CGA for safety on balance exercises                            PT Education - 07/07/15 1532    Education provided Yes   Education Details wider BOS to pick up things from floor more stable   Person(s) Educated Patient   Methods Explanation   Comprehension Verbalized understanding             PT Long Term Goals - 06/16/15 1427    PT LONG TERM GOAL #1   Title pt will improve Berg Balance score to >45 to decrease fall risk    Baseline 45/56   Time 4   Period Weeks   Status Partially Met   PT LONG TERM GOAL #2   Title pt will improve 5xSTS to 30 seconds to demonstrate improved strength   Baseline 30   Time 4   Period Weeks   Status Achieved  PT LONG TERM GOAL #3   Title pt will improve 51mwalk speed to 0.8 m/s for safe household mobility   Baseline 0.5 m/s   Time 4   Period Weeks   Status Achieved   PT LONG TERM GOAL #4   Title pt will improved TUG time to <20 sec for independent mobile   Baseline 31.47   Time 4   Period Weeks   Status Achieved   PT LONG TERM GOAL #5   Title pt will be able to ascend and decend 3 stairs in step over step pattern in order to independently enter home   Time 4   Period Weeks   Status Partially Met   Additional Long Term Goals   Additional Long Term Goals Yes   PT LONG TERM GOAL #6   Title pt will increase 6 min walk by 1084fto improve community mobility.    Time 4   Period Weeks   Status New   PT LONG TERM GOAL #7   Title pt will perform 5x sit to stand in <25s demonstrating improved LE strength    Time 4   Period Weeks   Status New   PT LONG TERM GOAL #8   Title pt will improve Berg to >51/56 demonstrating  reduced fall risk .   Time 4   Period Weeks   Status New               Plan - 07/07/15 1533    Clinical Impression Statement pt continues to progress well through dynamic balance tasks. pt continues to be anxious, but will do all exercises with a little encouragement. pt also needs cues to speed up stepping tasks.    Pt will benefit from skilled therapeutic intervention in order to improve on the following deficits Abnormal gait;Decreased activity tolerance;Decreased balance;Decreased endurance;Decreased strength;Dizziness   Rehab Potential Good   PT Frequency 2x / week   PT Duration 4 weeks   PT Treatment/Interventions ADLs/Self Care Home Management;Electrical Stimulation;Cryotherapy;Traction;Ultrasound;Gait training;Stair training;Functional mobility training;Therapeutic exercise;Balance training;Neuromuscular re-education;Patient/family education;Manual techniques;Passive range of motion;Energy conservation   PT Next Visit Plan progressnote   Consulted and Agree with Plan of Care Patient        Problem List Patient Active Problem List   Diagnosis Date Noted  . Degenerative joint disease (DJD) of hip 10/09/2014  . S/P total hip arthroplasty 10/09/2014   AsGorden HarmsTortorici, PT, DPT #[id number],Ashley 07/07/2015, 3:42 PM  CoCuyamungue GrantAIN REMilford Valley Memorial HospitalERVICES [address]dHarris [place]hone: [phone] Fax:  [phone]: Monica SenRN: [id number] of Birth: 10February 01, 1942

## 2015-07-09 ENCOUNTER — Ambulatory Visit: Payer: Medicare Other

## 2015-07-09 VITALS — BP 125/53 | HR 88

## 2015-07-09 DIAGNOSIS — R531 Weakness: Secondary | ICD-10-CM

## 2015-07-09 DIAGNOSIS — R2681 Unsteadiness on feet: Secondary | ICD-10-CM

## 2015-07-09 NOTE — Therapy (Signed)
[place] MAIN [facility] SERVICES [address] [place], [place], [address] Phone: [phone]   Fax:  [phone]  Physical Therapy Treatment  Patient Details  Name: Monica [name] MRN: [id number] Date of Birth: 06-21-1940 Referring Provider: Dr Carloyn Manner  Encounter Date: 07/09/2015      PT End of Session - 07/09/15 1357    Visit Number 14   Number of Visits 17   Date for PT Re-Evaluation 07/15/15   Authorization Type 6/10   PT Start Time 1350   PT Stop Time 1430   PT Time Calculation (min) 40 min   Equipment Utilized During Treatment Gait belt   Activity Tolerance Patient limited by fatigue;Patient tolerated treatment well   Behavior During Therapy [facility] for tasks assessed/performed;Anxious      Past Medical History  Diagnosis Date  . Arthritis   . Complication of anesthesia     woke up agitated following hernia surgery 02/2014  . Anxiety   . GERD (gastroesophageal reflux disease)     Past Surgical History  Procedure Laterality Date  . Hernia repair    . Total hip arthroplasty Right 10/09/2014    Procedure: TOTAL HIP ARTHROPLASTY;  Surgeon: Dereck Leep, MD;  Location: [facility] ORS;  Service: Orthopedics;  Laterality: Right;    Filed Vitals:   07/09/15 1354  BP: 125/53  Pulse: 88  SpO2: 99%    Visit Diagnosis:  Generalized weakness  Unsteadiness      Subjective Assessment - 07/09/15 1353    Subjective Pt reports she is doing well on this date but is a little sore since her last therapy session. She is performing HEP without issue. Pt reports she has been gradually increasing the duration of her walking. No specific questions or concerns at this time.    Pertinent History R THA, L knee OA, anxiety    Patient Stated Goals pick up something up off floor, reach her shoes, feel more confident moving   Currently in Pain? No/denies       Therex: Nustep L 3 x 5 min for warm-up during history (3 minutes unbilled); Sit to  stand from chair onto AIREX x5 eyes straight with RTB around knees to prevent adduction, minA+1 assist required; Standing squats with heavy cues for proper form/technique x 5; Standing heel raises x 10; Side stepping with 2# ankle weights 8' x 6; Standing hip abduction 2# ankle weights x 10 bilateral;   Pt requires min verbal and tactile cues for proper exercise performance    NMR:   6" stair tap with 2# ankle weights without UE support alternating LE x 10 each; Airex 6" stair tap with 2# ankle weights without UE support alternating LE x 10 each; Step-ups to 6" step with 2# ankle weights without UE support alternating LE x 5 each; Feet together static balance with eyes open/closed x 30 seconds each; Airex feet together static balance with eyes open/closed x 30 seconds each; Pt requires CGA for safety on balance exercises                             PT Education - 07/09/15 1357    Education provided Yes   Education Details HEP reinforced   Person(s) Educated Patient   Methods Explanation   Comprehension Verbalized understanding             PT Long Term Goals - 06/16/15 1427    PT LONG TERM GOAL #  1   Title pt will improve Berg Balance score to >45 to decrease fall risk    Baseline 45/56   Time 4   Period Weeks   Status Partially Met   PT LONG TERM GOAL #2   Title pt will improve 5xSTS to 30 seconds to demonstrate improved strength   Baseline 30   Time 4   Period Weeks   Status Achieved   PT LONG TERM GOAL #3   Title pt will improve 62mwalk speed to 0.8 m/s for safe household mobility   Baseline 0.5 m/s   Time 4   Period Weeks   Status Achieved   PT LONG TERM GOAL #4   Title pt will improved TUG time to <20 sec for independent mobile   Baseline 31.47   Time 4   Period Weeks   Status Achieved   PT LONG TERM GOAL #5   Title pt will be able to ascend and decend 3 stairs in step over step pattern in order to independently enter home   Time 4    Period Weeks   Status Partially Met   Additional Long Term Goals   Additional Long Term Goals Yes   PT LONG TERM GOAL #6   Title pt will increase 6 min walk by 1037fto improve community mobility.    Time 4   Period Weeks   Status New   PT LONG TERM GOAL #7   Title pt will perform 5x sit to stand in <25s demonstrating improved LE strength    Time 4   Period Weeks   Status New   PT LONG TERM GOAL #8   Title pt will improve Berg to >51/56 demonstrating reduced fall risk .   Time 4   Period Weeks   Status New               Plan - 07/09/15 1359    Clinical Impression Statement Pt demonstrates good progress with threapy on this date. Encouraged to continue HEP and follow-up as scheduled. Pt will continue to benefit from skilled PT services to address deficits in strength and balance in order to return to full function at home.    Pt will benefit from skilled therapeutic intervention in order to improve on the following deficits Abnormal gait;Decreased activity tolerance;Decreased balance;Decreased endurance;Decreased strength;Dizziness   Rehab Potential Good   PT Frequency 2x / week   PT Duration 4 weeks   PT Treatment/Interventions ADLs/Self Care Home Management;Electrical Stimulation;Cryotherapy;Traction;Ultrasound;Gait training;Stair training;Functional mobility training;Therapeutic exercise;Balance training;Neuromuscular re-education;Patient/family education;Manual techniques;Passive range of motion;Energy conservation   PT Next Visit Plan Outcome measures, update goals/g codes, recert?    PT Home Exercise Plan As prescribed   Consulted and Agree with Plan of Care Patient        Problem List Patient Active Problem List   Diagnosis Date Noted  . Degenerative joint disease (DJD) of hip 10/09/2014  . S/P total hip arthroplasty 10/09/2014   JaPhillips GroutT, DPT    Heaven Meeker 07/09/2015, 4:20 PM  CoBrewsterAIN REEl Paso Ltac HospitalSERVICES [address]dSkamokawa ValleyNC[place]hone: [phone] Fax:  [phone]: JoJacqualin ShirkeyRN: [id number] of Birth: 10Nov 15, 1942

## 2015-07-14 ENCOUNTER — Ambulatory Visit: Payer: Medicare Other

## 2015-07-14 DIAGNOSIS — R2681 Unsteadiness on feet: Secondary | ICD-10-CM

## 2015-07-14 DIAGNOSIS — R531 Weakness: Secondary | ICD-10-CM | POA: Diagnosis not present

## 2015-07-14 NOTE — Therapy (Signed)
[place] MAIN [facility] SERVICES [address] [place], [place], [address] Phone: [phone]   Fax:  [phone]  Physical Therapy Treatment  Patient Details  Name: Monica [name] MRN: [id number] Date of Birth: Jul 04, 1940 Referring Provider: Dr Carloyn Manner  Encounter Date: 07/14/2015      PT End of Session - 07/14/15 1427    Visit Number 15   Number of Visits 17   Date for PT Re-Evaluation 07/15/15   Authorization Type 7/10   PT Start Time 9735   PT Stop Time 1430   PT Time Calculation (min) 45 min   Equipment Utilized During Treatment Gait belt   Activity Tolerance Patient limited by fatigue;Patient tolerated treatment well   Behavior During Therapy [facility] for tasks assessed/performed;Anxious      Past Medical History  Diagnosis Date  . Arthritis   . Complication of anesthesia     woke up agitated following hernia surgery 02/2014  . Anxiety   . GERD (gastroesophageal reflux disease)     Past Surgical History  Procedure Laterality Date  . Hernia repair    . 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      Subjective Assessment - 07/14/15 1355    Subjective pt reports she is doing more at home. she is scared to carry her laundry basket due to balance.    Pertinent History R THA, L knee OA, anxiety    Patient Stated Goals pick up something up off floor, reach her shoes, feel more confident moving   Currently in Pain? No/denies        NMR nustep x 4 min no charge L4   Squat to stand with yellow med ball toss 2x10 Carrying 8lbs basket around gym placing it on the floor in different spots. Functional training for laundry  Standing on AIREX squat to pick up ball and toss into basket x 20 pick up balls from floor and put in basket, with and withoutUE  support x 2 min  Wide BOS on AIREX EO/ EC 20s x 8 Narrow BOS with trunk turns x 4 min VOR 1 with NBOS 30s x 2  pt requires CGA for safety on balance exercises                         PT Education - 07/14/15 1427    Education provided Yes   Education Details mechanics to lift. use UE support when avaliable    Person(s) Educated Patient   Methods Explanation   Comprehension Verbalized understanding             PT Long Term Goals - 06/16/15 1427    PT LONG TERM GOAL #1   Title pt will improve Berg Balance score to >45 to decrease fall risk    Baseline 45/56   Time 4   Period Weeks   Status Partially Met   PT LONG TERM GOAL #2   Title pt will improve 5xSTS to 30 seconds to demonstrate improved strength   Baseline 30   Time 4   Period Weeks   Status Achieved   PT LONG TERM GOAL #3   Title pt will improve 63mwalk speed to 0.8 m/s for safe household mobility   Baseline 0.5 m/s   Time  4   Period Weeks   Status Achieved   PT LONG TERM GOAL #4   Title pt will improved TUG time to <20 sec for independent mobile   Baseline 31.47   Time 4   Period Weeks   Status Achieved   PT LONG TERM GOAL #5   Title pt will be able to ascend and decend 3 stairs in step over step pattern in order to independently enter home   Time 4   Period Weeks   Status Partially Met   Additional Long Term Goals   Additional Long Term Goals Yes   PT LONG TERM GOAL #6   Title pt will increase 6 min walk by 153f to improve community mobility.    Time 4   Period Weeks   Status New   PT LONG TERM GOAL #7   Title pt will perform 5x sit to stand in <25s demonstrating improved LE strength    Time 4   Period Weeks   Status New   PT LONG TERM GOAL #8   Title pt will improve Berg to >51/56 demonstrating reduced fall risk .   Time 4   Period Weeks   Status New               Plan - 07/14/15 1428    Clinical Impression Statement pt continues to progress well regarding her  functional mobility and balance. pt is becoming more confident with her mobility as well.    Rehab Potential Good   PT Frequency 2x / week   PT Duration 4 weeks   PT Treatment/Interventions ADLs/Self Care Home Management;Electrical Stimulation;Cryotherapy;Traction;Ultrasound;Gait training;Stair training;Functional mobility training;Therapeutic exercise;Balance training;Neuromuscular re-education;Patient/family education;Manual techniques;Passive range of motion;Energy conservation   PT Next Visit Plan Outcome measures, update goals/g codes, recert?    PT Home Exercise Plan As prescribed   Consulted and Agree with Plan of Care Patient      Patient will benefit from skilled therapeutic intervention in order to improve the following deficits and impairments:  Abnormal gait, Decreased activity tolerance, Decreased balance, Decreased endurance, Decreased strength, Dizziness  Visit Diagnosis: Generalized weakness  Unsteadiness     Problem List Patient Active Problem List   Diagnosis Date Noted  . Degenerative joint disease (DJD) of hip 10/09/2014  . S/P total hip arthroplasty 10/09/2014   AGorden Harms Devann Cribb, PT, DPT #[id number] Monica [name] 07/14/2015, 2:29 PM  CAbramsMAIN RSelect Specialty Hospital - Daytona BeachSERVICES [address] [address]Phone: [phone]  Fax:  [phone] Name: Monica PatricelliMRN: [id number] of Birth: [birth year]

## 2015-07-16 ENCOUNTER — Ambulatory Visit: Payer: Medicare Other

## 2015-07-16 DIAGNOSIS — M6281 Muscle weakness (generalized): Secondary | ICD-10-CM

## 2015-07-16 DIAGNOSIS — R2681 Unsteadiness on feet: Secondary | ICD-10-CM

## 2015-07-16 DIAGNOSIS — R531 Weakness: Secondary | ICD-10-CM | POA: Diagnosis not present

## 2015-07-16 NOTE — Therapy (Signed)
[place] MAIN [facility] SERVICES [address] [place], [place], [address] Phone: [phone]   Fax:  [phone]  Physical Therapy Treatment / progress note   Patient Details  Name: Monica [name] MRN: [id number] Date of Birth: 09-09-1940 Referring Provider: Dr Carloyn Manner  Encounter Date: 07/16/2015      PT End of Session - 07/16/15 1445    Visit Number 16   Number of Visits 25   Date for PT Re-Evaluation 08/13/15   Authorization Type 1/10   PT Start Time 1345   PT Stop Time 1429   PT Time Calculation (min) 44 min   Equipment Utilized During Treatment Gait belt   Activity Tolerance Patient limited by fatigue;Patient tolerated treatment well   Behavior During Therapy [facility] for tasks assessed/performed;Anxious      Past Medical History  Diagnosis Date  . Arthritis   . Complication of anesthesia     woke up agitated following hernia surgery 02/2014  . Anxiety   . GERD (gastroesophageal reflux disease)     Past Surgical History  Procedure Laterality Date  . Hernia repair    . 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      Subjective Assessment - 07/16/15 1444    Subjective pt reports walking out in her yard yesterday where she felt a little wobbly   Pertinent History R THA, L knee OA, anxiety    Patient Stated Goals pick up something up off floor, reach her shoes, feel more confident moving   Currently in Pain? No/denies       gait training: Over ground outdoors both on uneven pavement, brick and grass- without AD, cues for upright gaze and inc walking speed Side stepping up/down hill 43f L/R  Retro walking up hill x x 319fx 2 Sit to stand from park bench no UE 3x5 Walking in grass over ground no AD 10051f 3 Walking in grass eyes closed [id number]  4 square stepping x 5 CW/CCW cues for in step length  Fwd step up onto grass, back to pavement x 20  pt requires CGA for safety on balance exercises                            PT Education - 07/16/15 1445    Education provided Yes   Education Details use cane in grass             PT Long Term Goals - 07/16/15 1447    PT LONG TERM GOAL #1   Title pt will improve Berg Balance score to >45 to decrease fall risk    Baseline 45/56   Time 4   Period Weeks   Status Partially Met   PT LONG TERM GOAL #2   Title pt will improve 5xSTS to 30 seconds to demonstrate improved strength   Baseline 30   Time 4   Period Weeks   Status Achieved   PT LONG TERM GOAL #3   Title pt will improve 43m 64m speed to 0.8 m/s for safe household mobility   Baseline 0.5 m/s   Time 4   Period Weeks   Status Achieved   PT LONG TERM GOAL #4   Title pt will improved TUG time  to <20 sec for independent mobile   Baseline 31.47   Time 4   Period Weeks   Status Achieved   PT LONG TERM GOAL #5   Title pt will be able to ascend and decend 3 stairs in step over step pattern in order to independently enter home   Time 4   Period Weeks   Status Achieved   PT LONG TERM GOAL #6   Title pt will increase 6 min walk by 112f to improve community mobility.    Time 4   Period Weeks   Status On-going   PT LONG TERM GOAL #7   Title pt will perform 5x sit to stand in <25s demonstrating improved LE strength    Time 4   Period Weeks   Status On-going   PT LONG TERM GOAL #8   Title pt will improve Berg to >51/56 demonstrating reduced fall risk .   Time 4   Period Weeks   Status On-going               Plan - 005/11/20171446    Clinical Impression Statement pt is progressing well towards PT goals, she demosntrates improved activity tolerance and stability, but still has some issue with dynamic balance. she has met most PT goals but would benefit from a short term extension of therapy  to maximize her function    Rehab Potential Good   PT Frequency 2x / week   PT Duration 4 weeks   PT Treatment/Interventions ADLs/Self Care Home Management;Electrical Stimulation;Cryotherapy;Traction;Ultrasound;Gait training;Stair training;Functional mobility training;Therapeutic exercise;Balance training;Neuromuscular re-education;Patient/family education;Manual techniques;Passive range of motion;Energy conservation   PT Next Visit Plan Outcome measures, update goals/g codes, recert?    PT Home Exercise Plan As prescribed   Consulted and Agree with Plan of Care Patient      Patient will benefit from skilled therapeutic intervention in order to improve the following deficits and impairments:  Abnormal gait, Decreased activity tolerance, Decreased balance, Decreased endurance, Decreased strength, Dizziness  Visit Diagnosis: Unsteadiness - Plan: PT plan of care cert/re-cert  Muscle weakness (generalized) - Plan: PT plan of care cert/re-cert       G-Codes - [id number]    Functional Assessment Tool Used berg/5xsittostand/626mwwalk   Functional Limitation Mobility: Walking and moving around   Mobility: Walking and Moving Around Current Status ([id number] least 1 percent but less than 20 percent impaired, limited or restricted   Mobility: Walking and Moving Around Goal Status ([id number] least 1 percent but less than 20 percent impaired, limited or restricted      Problem List Patient Active Problem List   Diagnosis Date Noted  . Degenerative joint disease (DJD) of hip 10/09/2014  . S/P total hip arthroplasty 10/09/2014   AsGorden HarmsTortorici, PT, DPT #[id number],Jesiah Yerby 07/08/09/20172:51 PM  CoBeltonAIN RESeiling Municipal HospitalERVICES [address]dHurstbourne AcresNC[place]hone: [phone] Fax:  [phone]: JoAlvine MostafaRN: [id number] of Birth: [birth year]

## 2015-07-21 ENCOUNTER — Ambulatory Visit: Payer: Medicare Other

## 2015-07-21 DIAGNOSIS — R531 Weakness: Secondary | ICD-10-CM | POA: Diagnosis not present

## 2015-07-21 DIAGNOSIS — M6281 Muscle weakness (generalized): Secondary | ICD-10-CM

## 2015-07-21 NOTE — Therapy (Signed)
[facility] [facility] MAIN [facility] SERVICES [address] [place], [place], [address] Phone: [phone]   Fax:  [phone]  Physical Therapy Treatment  Patient Details  Name: Monica [name] MRN: [id number] Date of Birth: [birth year] Referring Provider: Dr Bud Face  Encounter Date: 07/21/2015      PT End of Session - 07/21/15 1402    Visit Number 17   Number of Visits 25   Date for PT Re-Evaluation 08/13/15   Authorization Type 2/10   PT Start Time 1347   PT Stop Time 1430   PT Time Calculation (min) 43 min   Equipment Utilized During Treatment Gait belt   Activity Tolerance Patient limited by fatigue;Patient tolerated treatment well   Behavior During Therapy [facility] for tasks assessed/performed;Anxious      Past Medical History  Diagnosis Date  . Arthritis   . Complication of anesthesia     woke up agitated following hernia surgery 02/2014  . Anxiety   . GERD (gastroesophageal reflux disease)     Past Surgical History  Procedure Laterality Date  . Hernia repair    . 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      Subjective Assessment - 07/21/15 1352    Subjective pt reports she is a little sore from increasing her home activity/IADLs   Pertinent History R THA, L knee OA, anxiety    Patient Stated Goals pick up something up off floor, reach her shoes, feel more confident moving   Currently in Pain? Yes   Pain Score 1    Pain Location --  general LE soreness        therex Leg press 90lbs 3x10 Standing resisted hip flexion, abduction , and extension with  red      Band 2x 10 each way bilaterally Side stepping red band in // bars x 3 laps  Sit to stand with band around knees x 10 Clamshell 2x10 bilaterally  Bridge with red band around knees 2x10  PT  performed piriformis stretching and gentle RLE distraction 30s x 4, PROM R hip  R hip very limited in external rotation                             PT Long Term Goals - 07/16/15 1447    PT LONG TERM GOAL #1   Title pt will improve Berg Balance score to >45 to decrease fall risk    Baseline 45/56   Time 4   Period Weeks   Status Partially Met   PT LONG TERM GOAL #2   Title pt will improve 5xSTS to 30 seconds to demonstrate improved strength   Baseline 30   Time 4   Period Weeks   Status Achieved   PT LONG TERM GOAL #3   Title pt will improve 92m walk speed to 0.8 m/s for safe household mobility   Baseline 0.5 m/s   Time 4   Period Weeks   Status Achieved   PT LONG TERM GOAL #4   Title pt will improved TUG time to <20 sec for independent mobile   Baseline 31.47   Time 4   Period Weeks   Status Achieved   PT LONG TERM GOAL #5   Title pt will be  able to ascend and decend 3 stairs in step over step pattern in order to independently enter home   Time 4   Period Weeks   Status Achieved   PT LONG TERM GOAL #6   Title pt will increase 6 min walk by 110f to improve community mobility.    Time 4   Period Weeks   Status On-going   PT LONG TERM GOAL #7   Title pt will perform 5x sit to stand in <25s demonstrating improved LE strength    Time 4   Period Weeks   Status On-going   PT LONG TERM GOAL #8   Title pt will improve Berg to >51/56 demonstrating reduced fall risk .   Time 4   Period Weeks   Status On-going               Plan - 07/21/15 1402    Clinical Impression Statement pt did well with HEP progression of resistance exercises. she shows good motivation and improving independence with physcial activity, giat and ADLs   Rehab Potential Good   PT Frequency 2x / week   PT Duration 4 weeks   PT Treatment/Interventions ADLs/Self Care Home Management;Electrical Stimulation;Cryotherapy;Traction;Ultrasound;Gait training;Stair  training;Functional mobility training;Therapeutic exercise;Balance training;Neuromuscular re-education;Patient/family education;Manual techniques;Passive range of motion;Energy conservation   PT Next Visit Plan Outcome measures, update goals/g codes, recert?    PT Home Exercise Plan As prescribed   Consulted and Agree with Plan of Care Patient      Patient will benefit from skilled therapeutic intervention in order to improve the following deficits and impairments:  Abnormal gait, Decreased activity tolerance, Decreased balance, Decreased endurance, Decreased strength, Dizziness  Visit Diagnosis: Muscle weakness (generalized)     Problem List Patient Active Problem List   Diagnosis Date Noted  . Degenerative joint disease (DJD) of hip 10/09/2014  . S/P total hip arthroplasty 10/09/2014   Monica [name], PT, DPT #[id number] Monica [name] 07/21/2015, 2:03 PM  [place] [facility] [address] [address]Phone: [phone]  Fax:  [phone] Name: Monica FilosaMRN: [id number] of Birth: [birth year]

## 2015-07-23 ENCOUNTER — Ambulatory Visit: Payer: Medicare Other

## 2015-07-23 DIAGNOSIS — M6281 Muscle weakness (generalized): Secondary | ICD-10-CM

## 2015-07-23 DIAGNOSIS — R531 Weakness: Secondary | ICD-10-CM | POA: Diagnosis not present

## 2015-07-23 NOTE — Therapy (Signed)
[place] MAIN [facility] SERVICES [address] [place], [place], [address] Phone: [phone]   Fax:  [phone]  Physical Therapy Treatment  Patient Details  Name: Monica [name] MRN: [id number] Date of Birth: Dec 09, 1940 Referring Provider: Dr Carloyn Manner  Encounter Date: 07/23/2015      PT End of Session - 07/23/15 1351    Visit Number 18   Number of Visits 25   Date for PT Re-Evaluation 08/13/15   Authorization Type 3/10   PT Start Time 5462   PT Stop Time 1430   PT Time Calculation (min) 45 min   Equipment Utilized During Treatment Gait belt   Activity Tolerance Patient limited by fatigue;Patient tolerated treatment well   Behavior During Therapy [facility] for tasks assessed/performed;Anxious      Past Medical History  Diagnosis Date  . Arthritis   . Complication of anesthesia     woke up agitated following hernia surgery 02/2014  . Anxiety   . GERD (gastroesophageal reflux disease)     Past Surgical History  Procedure Laterality Date  . Hernia repair    . 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      Subjective Assessment - 07/23/15 1351    Subjective pt reports she feels stiff   Pertinent History R THA, L knee OA, anxiety    Patient Stated Goals pick up something up off floor, reach her shoes, feel more confident moving   Currently in Pain? No/denies      Manual therapy: R hip mobilizations grade III inferior, anterior and lateral glides 15s bouts x 4 Long axis distraction of RLE grade III 10s x 10 PROM of R hip into flexion and ER gentle over pressure oscillations at end range 20s x 5 bouts MET to R piriformis x 2 min  Therex: BOSU lunge x 10 each leg  Toe taps on BOSU no UE x 20 Clock reach x 3 min each leg                                  PT Long Term Goals - 07/16/15 1447    PT LONG TERM GOAL #1   Title pt will improve Berg Balance score to >45 to decrease fall risk    Baseline 45/56   Time 4   Period Weeks   Status Partially Met   PT LONG TERM GOAL #2   Title pt will improve 5xSTS to 30 seconds to demonstrate improved strength   Baseline 30   Time 4   Period Weeks   Status Achieved   PT LONG TERM GOAL #3   Title pt will improve 90mwalk speed to 0.8 m/s for safe household mobility   Baseline 0.5 m/s   Time 4   Period Weeks   Status Achieved   PT LONG TERM GOAL #4   Title pt will improved TUG time to <20 sec for independent mobile   Baseline 31.47   Time 4   Period Weeks   Status Achieved   PT LONG TERM GOAL #5   Title pt will be able to ascend and decend 3 stairs in step over step pattern in order to independently enter home   Time 4   Period  Weeks   Status Achieved   PT LONG TERM GOAL #6   Title pt will increase 6 min walk by 190f to improve community mobility.    Time 4   Period Weeks   Status On-going   PT LONG TERM GOAL #7   Title pt will perform 5x sit to stand in <25s demonstrating improved LE strength    Time 4   Period Weeks   Status On-going   PT LONG TERM GOAL #8   Title pt will improve Berg to >51/56 demonstrating reduced fall risk .   Time 4   Period Weeks   Status On-going               Plan - 07/23/15 1352    Clinical Impression Statement pt demonstrated some improvement in hip flexion and ER after manual therapy enabling her to cross her legs. pt has very stiff end feels into little ER/ IR and flexion. pt continues to do well with her exercise   Rehab Potential Good   PT Frequency 2x / week   PT Duration 4 weeks   PT Treatment/Interventions ADLs/Self Care Home Management;Electrical Stimulation;Cryotherapy;Traction;Ultrasound;Gait training;Stair training;Functional mobility training;Therapeutic exercise;Balance  training;Neuromuscular re-education;Patient/family education;Manual techniques;Passive range of motion;Energy conservation   PT Next Visit Plan Outcome measures, update goals/g codes, recert?    PT Home Exercise Plan As prescribed   Consulted and Agree with Plan of Care Patient      Patient will benefit from skilled therapeutic intervention in order to improve the following deficits and impairments:  Abnormal gait, Decreased activity tolerance, Decreased balance, Decreased endurance, Decreased strength, Dizziness  Visit Diagnosis: Muscle weakness (generalized)     Problem List Patient Active Problem List   Diagnosis Date Noted  . Degenerative joint disease (DJD) of hip 10/09/2014  . S/P total hip arthroplasty 10/09/2014    Monica [name] 07/23/2015, 2:20 PM  [place] [facility] [address] [address]Phone: [phone]  Fax:  [phone] Name: Monica NeryMRN: [id number] of Birth: [birth year]

## 2015-07-28 ENCOUNTER — Ambulatory Visit: Payer: Medicare Other

## 2015-07-28 DIAGNOSIS — R531 Weakness: Secondary | ICD-10-CM | POA: Diagnosis not present

## 2015-07-28 DIAGNOSIS — M6281 Muscle weakness (generalized): Secondary | ICD-10-CM

## 2015-07-28 NOTE — Patient Instructions (Signed)
[email or web address]  Hamstrings stretch 30s x 2 each leg Hip adductor stretch 30s x 2 each leg

## 2015-07-28 NOTE — Therapy (Signed)
[place] MAIN [facility] SERVICES [address] [place], [place], [address] Phone: [phone]   Fax:  [phone]  Physical Therapy Treatment  Patient Details  Name: Monica [name] MRN: [id number] Date of Birth: 11-25-1940 Referring Provider: Dr Carloyn Manner  Encounter Date: 07/28/2015      PT End of Session - 07/28/15 1445    Visit Number 19   Number of Visits 25   Date for PT Re-Evaluation 08/13/15   Authorization Type 4/10   PT Start Time 0254   PT Stop Time 1428   PT Time Calculation (min) 43 min   Equipment Utilized During Treatment Gait belt   Activity Tolerance Patient limited by fatigue;Patient tolerated treatment well   Behavior During Therapy [facility] for tasks assessed/performed;Anxious      Past Medical History  Diagnosis Date  . Arthritis   . Complication of anesthesia     woke up agitated following hernia surgery 02/2014  . Anxiety   . GERD (gastroesophageal reflux disease)     Past Surgical History  Procedure Laterality Date  . Hernia repair    . 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      Subjective Assessment - 07/28/15 1353    Subjective (p) pt reports her hip felt a little better after last visit    Pertinent History (p) R THA, L knee OA, anxiety    Patient Stated Goals (p) pick up something up off floor, reach her shoes, feel more confident moving   Currently in Pain? (p) No/denies      Manual therapy  R hip mobilizations grade III inferior, anterior and lateral glides 15s bouts x 3 Long axis distraction of RLE grade III 10s x 10 PROM of R hip into flexion and ER gentle over pressure oscillations at end range 20s x 5 bouts IT band stretch 30s x 4 Hamstrings stretching and MET x 4 min Hip adductor stretch and MET x 2  min  therex Hamstrings and hip adductor stretch on step 30s x 3 bilaterally Squat to pick up ball standing on [employer] and toss into bucket x 20  Pt requires min verbal and tactile cues for proper exercise performance                             PT Education - 07/28/15 1445    Education provided Yes   Education Details hip stretching.    Person(s) Educated Patient   Methods Explanation   Comprehension Verbalized understanding             PT Long Term Goals - 07/16/15 1447    PT LONG TERM GOAL #1   Title pt will improve Berg Balance score to >45 to decrease fall risk    Baseline 45/56   Time 4   Period Weeks   Status Partially Met   PT LONG TERM GOAL #2   Title pt will improve 5xSTS to 30 seconds to demonstrate improved strength   Baseline 30   Time 4   Period Weeks   Status Achieved   PT LONG TERM GOAL #3   Title pt will improve 78mwalk speed to 0.8 m/s for safe household mobility   Baseline 0.5 m/s   Time 4  Period Weeks   Status Achieved   PT LONG TERM GOAL #4   Title pt will improved TUG time to <20 sec for independent mobile   Baseline 31.47   Time 4   Period Weeks   Status Achieved   PT LONG TERM GOAL #5   Title pt will be able to ascend and decend 3 stairs in step over step pattern in order to independently enter home   Time 4   Period Weeks   Status Achieved   PT LONG TERM GOAL #6   Title pt will increase 6 min walk by 100ft to improve community mobility.    Time 4   Period Weeks   Status On-going   PT LONG TERM GOAL #7   Title pt will perform 5x sit to stand in <25s demonstrating improved LE strength    Time 4   Period Weeks   Status On-going   PT LONG TERM GOAL #8   Title pt will improve Berg to >51/56 demonstrating reduced fall risk .   Time 4   Period Weeks   Status On-going               Plan - 07/28/15 1446    Clinical Impression Statement progressed HEP flexibility program today to hopefully improve her  hip ROM following DC. plan to assess pt for DC next visit. discussed with pt gradual return to her regular activities as physically she is able, but is held back somewhat by her anxiety. pt agrees with this plan.    Rehab Potential Good   PT Frequency 2x / week   PT Duration 4 weeks   PT Treatment/Interventions ADLs/Self Care Home Management;Electrical Stimulation;Cryotherapy;Traction;Ultrasound;Gait training;Stair training;Functional mobility training;Therapeutic exercise;Balance training;Neuromuscular re-education;Patient/family education;Manual techniques;Passive range of motion;Energy conservation   PT Next Visit Plan Outcome measures, update goals/g codes, recert?    PT Home Exercise Plan As prescribed   Consulted and Agree with Plan of Care Patient      Patient will benefit from skilled therapeutic intervention in order to improve the following deficits and impairments:  Abnormal gait, Decreased activity tolerance, Decreased balance, Decreased endurance, Decreased strength, Dizziness  Visit Diagnosis: Muscle weakness (generalized)     Problem List Patient Active Problem List   Diagnosis Date Noted  . Degenerative joint disease (DJD) of hip 10/09/2014  . S/P total hip arthroplasty 10/09/2014   Ashley C. Tortorici, PT, DPT #[id number]  Tortorici,Ashley 07/28/2015, 2:48 PM  [place] [facility] MAIN REHAB SERVICES [address] [place], [place], [address] Phone: [phone]   Fax:  [phone]  Name: Monica [name] MRN: [id number] Date of Birth: 12/11/1940     

## 2015-07-30 ENCOUNTER — Ambulatory Visit: Payer: Medicare Other

## 2015-07-30 DIAGNOSIS — R2681 Unsteadiness on feet: Secondary | ICD-10-CM

## 2015-07-30 DIAGNOSIS — R531 Weakness: Secondary | ICD-10-CM | POA: Diagnosis not present

## 2015-07-30 DIAGNOSIS — M6281 Muscle weakness (generalized): Secondary | ICD-10-CM

## 2015-07-30 NOTE — Therapy (Signed)
[place] [facility] MAIN [facility] SERVICES [address] [place], [place], [address] Phone: [phone]   Fax:  [phone]  Physical Therapy Treatment/Discharge summary  Patient Details  Name: Monica [name] MRN: [id number] Date of Birth: 12/25/1940 Referring Provider: Dr Bud Face  Encounter Date: 07/30/2015      Monica [name] End of Session - 07/30/15 1429    Visit Number 20   Number of Visits 25   Date for Monica [name] Re-Evaluation 08/13/15   Authorization Type 5/10   Monica [name] Start Time 1345   Monica [name] Stop Time 1430   Monica [name] Time Calculation (min) 45 min   Equipment Utilized During Treatment Gait belt   Activity Tolerance Patient limited by fatigue;Patient tolerated treatment well   Behavior During Therapy [facility] for tasks assessed/performed;Anxious      Past Medical History  Diagnosis Date  . Arthritis   . Complication of anesthesia     woke up agitated following hernia surgery 02/2014  . Anxiety   . GERD (gastroesophageal reflux disease)     Past Surgical History  Procedure Laterality Date  . Hernia repair    . 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      Subjective Assessment - 07/30/15 1428    Subjective Monica [name] reports she feels so much better    Pertinent History R THA, L knee OA, anxiety    Patient Stated Goals pick up something up off floor, reach her shoes, feel more confident moving   Currently in Pain? No/denies       Monica [name] reassessed outcome measures and progress towards goals as follows      [facility] Monica [name] Assessment - 07/30/15 0001    Standardized Balance Assessment   Five times sit to stand comments  14.3s   10 Meter Walk 1.08/ms   Berg Balance Test   Sit to Stand Able to stand without using hands and stabilize independently   Standing Unsupported Able to stand safely 2 minutes   Sitting with Back Unsupported but Feet Supported on Floor or Stool Able to sit safely and securely 2 minutes   Stand to Sit Sits safely with minimal use of hands   Transfers Able to transfer safely, minor use of hands   Standing Unsupported with Eyes Closed Able to stand 10 seconds safely   Standing Ubsupported with Feet Together Able to place feet together independently and stand 1 minute safely   From Standing, Reach Forward with Outstretched Arm Can reach confidently >25 cm (10")   From Standing Position, Pick up Object from Floor Able to pick up shoe safely and easily   From Standing Position, Turn to Look Behind Over each Shoulder Looks behind from both sides and weight shifts well   Turn 360 Degrees Able to turn 360 degrees safely in 4 seconds or less   Standing Unsupported, Alternately Place Feet on Step/Stool Able to stand independently and safely and complete 8 steps in 20 seconds   Standing Unsupported, One Foot in Front Able to plae foot ahead of the other independently and hold 30 seconds   Standing on One Leg Able to lift leg independently and hold > 10 seconds   Total Score 55   Timed Up and Go Test   Normal TUG (seconds) 12     6 min walk: 1053ft Monica [name] has  made significant progress on all measures                         Monica [name] Education - 07/30/15 1429    Education provided Yes   Education Details recommend continue walking program, silver sneakers   Person(s) Educated Patient   Methods Explanation   Comprehension Verbalized understanding             Monica [name] Long Term Goals - 07/30/15 1430    Monica [name] LONG TERM GOAL #1   Title Monica [name] will improve Berg Balance score to >45 to decrease fall risk    Baseline 45/56   Time 4   Period Weeks   Status Achieved   Monica [name] LONG TERM GOAL #2   Title Monica [name] will improve 5xSTS to 30 seconds to demonstrate improved strength   Baseline 30   Time 4   Period Weeks   Status Achieved   Monica [name] LONG TERM GOAL #3   Title Monica [name] will improve 1753m  walk speed to 0.8 m/s for safe household mobility   Baseline 0.5 m/s   Time 4   Period Weeks   Status Achieved   Monica [name] LONG TERM GOAL #4   Title Monica [name] will improved TUG time to <20 sec for independent mobile   Baseline 31.47   Time 4   Period Weeks   Status Achieved   Monica [name] LONG TERM GOAL #5   Title Monica [name] will be able to ascend and decend 3 stairs in step over step pattern in order to independently enter home   Time 4   Period Weeks   Status Achieved   Monica [name] LONG TERM GOAL #6   Title Monica [name] will increase 6 min walk by 17600ft to improve community mobility.    Time 4   Period Weeks   Status Achieved   Monica [name] LONG TERM GOAL #7   Title Monica [name] will perform 5x sit to stand in <25s demonstrating improved LE strength    Time 4   Period Weeks   Status Achieved   Monica [name] LONG TERM GOAL #8   Title Monica [name] will improve Berg to >51/56 demonstrating reduced fall risk .   Time 4   Period Weeks   Status Achieved               Plan - 07/30/15 1429    Clinical Impression Statement Monica [name] has achieved all Monica [name] goals and has made outstanding progress in physical therapy. she is no longer a high fall risk and has returned to her PLOF. Monica [name] will be DC after todays session    Rehab Potential Good   Monica [name] Frequency 2x / week   Monica [name] Duration 4 weeks   Monica [name] Treatment/Interventions ADLs/Self Care Home Management;Electrical Stimulation;Cryotherapy;Traction;Ultrasound;Gait training;Stair training;Functional mobility training;Therapeutic exercise;Balance training;Neuromuscular re-education;Patient/family education;Manual techniques;Passive range of motion;Energy conservation   Monica [name] Next Visit Plan Outcome measures, update goals/g codes, recert?    Monica [name] Home Exercise Plan As prescribed   Consulted and Agree with Plan of Care Patient      Patient will benefit from skilled therapeutic intervention in order to improve the following deficits and impairments:  Abnormal gait, Decreased activity tolerance, Decreased balance, Decreased endurance, Decreased  strength, Dizziness  Visit Diagnosis: Muscle weakness (generalized)  Unsteadiness       G-Codes - 07/30/15 1455    Functional Assessment Tool Used berg/10153mwalk/6minwalk/tug/5xsittostand   Functional Limitation Mobility: Walking and moving around   Mobility: Walking and Moving Around Current Status ([id number]) At least 1  percent but less than 20 percent impaired, limited or restricted   Mobility: Walking and Moving Around Goal Status [phone]) At least 1 percent but less than 20 percent impaired, limited or restricted   Mobility: Walking and Moving Around Discharge Status [phone]) At least 1 percent but less than 20 percent impaired, limited or restricted      Problem List Patient Active Problem List   Diagnosis Date Noted  . Degenerative joint disease (DJD) of hip 10/09/2014  . S/P total hip arthroplasty 10/09/2014   Monica [name], Monica [name], Monica [name] [phone]   Burnell Hurta 07/30/2015, 2:56 PM  [place] [facility] MAIN [facility] SERVICES [address] [place], [place], [address] Phone: [phone]   Fax:  [phone]  Name: Roniya Tetro MRN: [id number] Date of Birth: May 14, 1940

## 2016-05-07 ENCOUNTER — Other Ambulatory Visit: Payer: Self-pay | Admitting: Internal Medicine

## 2016-05-07 DIAGNOSIS — Z1231 Encounter for screening mammogram for malignant neoplasm of breast: Secondary | ICD-10-CM

## 2016-05-18 ENCOUNTER — Ambulatory Visit
Admission: RE | Admit: 2016-05-18 | Discharge: 2016-05-18 | Disposition: A | Discharge status: Home / Self Care | Payer: Medicare Other | Source: Ambulatory Visit | Attending: Internal Medicine | Admitting: Internal Medicine

## 2016-05-18 DIAGNOSIS — Z1231 Encounter for screening mammogram for malignant neoplasm of breast: Secondary | ICD-10-CM | POA: Insufficient documentation

## 2017-06-03 ENCOUNTER — Other Ambulatory Visit: Payer: Self-pay | Admitting: Internal Medicine

## 2017-06-03 DIAGNOSIS — Z1231 Encounter for screening mammogram for malignant neoplasm of breast: Secondary | ICD-10-CM

## 2017-06-08 ENCOUNTER — Ambulatory Visit
Admission: RE | Admit: 2017-06-08 | Discharge: 2017-06-08 | Disposition: A | Discharge status: Home / Self Care | Payer: Medicare Other | Source: Ambulatory Visit | Attending: Internal Medicine | Admitting: Internal Medicine

## 2017-06-08 DIAGNOSIS — Z1231 Encounter for screening mammogram for malignant neoplasm of breast: Secondary | ICD-10-CM | POA: Insufficient documentation

## 2018-01-01 NOTE — Discharge Instructions (Signed)
Instructions after Total Hip Replacement ° ° °  Burk Hoctor P. Seve Monette, Jr., M.D.    ° Dept. of Orthopaedics & Sports Medicine ° [facility] ° [address] ° [place], [place]  [address] ° Phone: [phone]   Fax: [phone] ° °  °DIET: °• Drink plenty of non-alcoholic fluids. °• Resume your normal diet. Include foods high in fiber. ° °ACTIVITY:  °• You may use crutches or a walker with weight-bearing as tolerated, unless instructed otherwise. °• You may be weaned off of the walker or crutches by your Physical Therapist.  °• Do NOT reach below the level of your knees or cross your legs until allowed.    °• Continue doing gentle exercises. Exercising will reduce the pain and swelling, increase motion, and prevent muscle weakness.   °• Please continue to use the TED compression stockings for 6 weeks. You may remove the stockings at night, but should reapply them in the morning. °• Do not drive or operate any equipment until instructed. ° °WOUND CARE:  °• Continue to use ice packs periodically to reduce pain and swelling. °• Keep the incision clean and dry. °• You may bathe or shower after the staples are removed at the first office visit following surgery. ° °MEDICATIONS: °• You may resume your regular medications. °• Please take the pain medication as prescribed on the medication. °• Do not take pain medication on an empty stomach. °• You have been given a prescription for a blood thinner to prevent blood clots. Please take the medication as instructed. (NOTE: After completing a 2 week course of Lovenox, take one Enteric-coated aspirin once a day.) °• Pain medications and iron supplements can cause constipation. Use a stool softener (Senokot or Colace) on a daily basis and a laxative (dulcolax or miralax) as needed. °• Do not drive or drink alcoholic beverages when taking pain medications. ° °CALL THE OFFICE FOR: °• Temperature above 101 degrees °• Excessive bleeding or drainage on the dressing. °• Excessive  swelling, coldness, or paleness of the toes. °• Persistent nausea and vomiting. ° °FOLLOW-UP:  °• You should have an appointment to return to the office in 6 weeks after surgery. °• Arrangements have been made for continuation of Physical Therapy (either home therapy or outpatient therapy). °  °

## 2018-01-05 ENCOUNTER — Encounter
Admission: RE | Admit: 2018-01-05 | Discharge: 2018-01-05 | Disposition: A | Discharge status: Home / Self Care | Payer: Medicare Other | Source: Ambulatory Visit | Attending: Orthopedic Surgery | Admitting: Orthopedic Surgery

## 2018-01-05 ENCOUNTER — Other Ambulatory Visit: Payer: Self-pay

## 2018-01-05 DIAGNOSIS — J449 Chronic obstructive pulmonary disease, unspecified: Secondary | ICD-10-CM | POA: Insufficient documentation

## 2018-01-05 DIAGNOSIS — Z01818 Encounter for other preprocedural examination: Secondary | ICD-10-CM | POA: Diagnosis present

## 2018-01-05 HISTORY — DX: Chronic obstructive pulmonary disease, unspecified: J44.9

## 2018-01-05 LAB — CBC WITH DIFFERENTIAL/PLATELET
BASOS ABS: 0.1 10*3/uL (ref 0–0.1)
Basophils Relative: 1 %
Eosinophils Absolute: 0.1 10*3/uL (ref 0–0.7)
Eosinophils Relative: 1 %
HEMATOCRIT: 40 % (ref 35.0–47.0)
Hemoglobin: 13.6 g/dL (ref 12.0–16.0)
LYMPHS PCT: 24 %
Lymphs Abs: 1.9 10*3/uL (ref 1.0–3.6)
MCH: 30 pg (ref 26.0–34.0)
MCHC: 34.1 g/dL (ref 32.0–36.0)
MCV: 87.9 fL (ref 80.0–100.0)
Monocytes Absolute: 0.6 10*3/uL (ref 0.2–0.9)
Monocytes Relative: 8 %
NEUTROS ABS: 5.2 10*3/uL (ref 1.4–6.5)
Neutrophils Relative %: 66 %
PLATELETS: 223 10*3/uL (ref 150–440)
RBC: 4.55 MIL/uL (ref 3.80–5.20)
RDW: 13.3 % (ref 11.5–14.5)
WBC: 7.8 10*3/uL (ref 3.6–11.0)

## 2018-01-05 LAB — COMPREHENSIVE METABOLIC PANEL
ALK PHOS: 73 U/L (ref 38–126)
ALT: 18 U/L (ref 0–44)
ANION GAP: 9 (ref 5–15)
AST: 28 U/L (ref 15–41)
Albumin: 4 g/dL (ref 3.5–5.0)
BUN: 12 mg/dL (ref 8–23)
CALCIUM: 9.9 mg/dL (ref 8.9–10.3)
CO2: 29 mmol/L (ref 22–32)
Chloride: 105 mmol/L (ref 98–111)
Creatinine, Ser: 0.43 mg/dL — ABNORMAL LOW (ref 0.44–1.00)
GFR calc non Af Amer: >60 mL/min (ref >60)
Glucose, Bld: 97 mg/dL (ref 70–99)
Potassium: 3.8 mmol/L (ref 3.5–5.1)
Sodium: 143 mmol/L (ref 135–145)
Total Bilirubin: 0.9 mg/dL (ref 0.3–1.2)
Total Protein: 7.2 g/dL (ref 6.5–8.1)

## 2018-01-05 LAB — TYPE AND SCREEN
ABO/RH(D): O NEG
Antibody Screen: NEGATIVE

## 2018-01-05 LAB — URINALYSIS, ROUTINE W REFLEX MICROSCOPIC
Bilirubin Urine: NEGATIVE
GLUCOSE, UA: NEGATIVE mg/dL
HGB URINE DIPSTICK: NEGATIVE
Ketones, ur: NEGATIVE mg/dL
Leukocytes, UA: NEGATIVE
Nitrite: NEGATIVE
PH: 7 (ref 5.0–8.0)
PROTEIN: NEGATIVE mg/dL
SPECIFIC GRAVITY, URINE: 1.012 (ref 1.005–1.030)

## 2018-01-05 LAB — PROTIME-INR
INR: 0.97
PROTHROMBIN TIME: 12.8 s (ref 11.4–15.2)

## 2018-01-05 LAB — SEDIMENTATION RATE: Sed Rate: 6 mm/hr (ref 0–30)

## 2018-01-05 LAB — APTT: APTT: 28 s (ref 24–36)

## 2018-01-05 LAB — SURGICAL PCR SCREEN

## 2018-01-05 NOTE — Patient Instructions (Signed)
Your procedure is scheduled on:01/18/18 Report to Day Surgery. [facility] SECOND FLOOR To find out your arrival time please call [phone] between 1PM - 3PM on 01/17/18  Remember: Instructions that are not followed completely may result in serious medical risk,  up to and including death, or upon the discretion of your surgeon and anesthesiologist your  surgery may need to be rescheduled.     _X__ 1. Do not eat food after midnight the night before your procedure.                 No gum chewing or hard candies. You may drink clear liquids up to 2 hours                 before you are scheduled to arrive for your surgery- DO not drink clear                 liquids within 2 hours of the start of your surgery.                 Clear Liquids include:  water, apple juice without pulp, clear carbohydrate                 drink such as Clearfast of Gatorade, Black Coffee or Tea (Do not add                 anything to coffee or tea).  __X__2.  On the morning of surgery brush your teeth with toothpaste and water, you                may rinse your mouth with mouthwash if you wish.  Do not swallow any toothpaste of mouthwash.     _X__ 3.  No Alcohol for 24 hours before or after surgery.   _X__ 4.  Do Not Smoke or use e-cigarettes For 24 Hours Prior to Your Surgery.                 Do not use any chewable tobacco products for at least 6 hours prior to                 surgery.  ____  5.  Bring all medications with you on the day of surgery if instructed.   _X___  6.  Notify your doctor if there is any change in your medical condition      (cold, fever, infections).     Do not wear jewelry, make-up, hairpins, clips or nail polish. Do not wear lotions, powders, or perfumes. You may wear deodorant. Do not shave 48 hours prior to surgery. Men may shave face and neck. Do not bring valuables to the hospital.    [facility] is not responsible for any belongings or  valuables.  Contacts, dentures or bridgework may not be worn into surgery. Leave your suitcase in the car. After surgery it may be brought to your room. For patients admitted to the hospital, discharge time is determined by your treatment team.   Patients discharged the day of surgery will not be allowed to drive home.   Please read over the following fact sheets that you were given:   Surgical Site Infection Prevention /MRSA  ___X_ Take these medicines the morning of surgery with A SIP OF WATER:    1. NONE UNLESS YOU WANT TO TAKE CLONAZEPAM  2.   3.   4.  5.  6.  ____ Fleet Enema (as directed)   __X__ Use  CHG Soap as directed  ____ Use inhalers on the day of surgery  ____ Stop metformin 2 days prior to surgery    ____ Take 1/2 of usual insulin dose the night before surgery. No insulin the morning          of surgery.   ____ Stop Coumadin/Plavix/aspirin on   __X__ Stop Anti-inflammatories on      STOP MOBIC 1 WEEK BEFORE SURGERY      ASK DR HOOTEN'S OFFICE ABOUT CONTINUING ASPERCREAM  ____ Stop supplements until after surgery.    ____ Bring C-Pap to the hospital.

## 2018-01-06 ENCOUNTER — Encounter
Admission: RE | Admit: 2018-01-06 | Discharge: 2018-01-06 | Disposition: A | Discharge status: Home / Self Care | Payer: Medicare Other | Source: Ambulatory Visit | Attending: Orthopedic Surgery | Admitting: Orthopedic Surgery

## 2018-01-06 DIAGNOSIS — Z01818 Encounter for other preprocedural examination: Secondary | ICD-10-CM | POA: Diagnosis not present

## 2018-01-06 LAB — SURGICAL PCR SCREEN
MRSA, PCR: NEGATIVE
Staphylococcus aureus: NEGATIVE

## 2018-01-06 LAB — C-REACTIVE PROTEIN: CRP: <0.8 mg/dL (ref <1.0)

## 2018-01-07 LAB — URINE CULTURE: Special Requests: NORMAL

## 2018-01-08 LAB — IGE: IGE (IMMUNOGLOBULIN E), SERUM: 19 [IU]/mL (ref 6–495)

## 2018-01-17 ENCOUNTER — Encounter: Payer: Self-pay | Admitting: Anesthesiology

## 2018-01-17 MED ORDER — TRANEXAMIC ACID-NACL 1000-0.7 MG/100ML-% IV SOLN
1000.0000 mg | INTRAVENOUS | Status: AC
  Administered 2018-01-18: 1000 mg via INTRAVENOUS
  Filled 2018-01-17: qty 100

## 2018-01-17 MED ORDER — CEFAZOLIN SODIUM-DEXTROSE 2-4 GM/100ML-% IV SOLN
2.0000 g | INTRAVENOUS | Status: AC
  Administered 2018-01-18: 2 g via INTRAVENOUS

## 2018-01-18 ENCOUNTER — Inpatient Hospital Stay: Payer: Medicare Other

## 2018-01-18 ENCOUNTER — Inpatient Hospital Stay
Admission: RE | Admit: 2018-01-18 | Discharge: 2018-01-20 | DRG: 470 | Disposition: A | Discharge status: Home Health | Payer: Medicare Other | Attending: Orthopedic Surgery | Admitting: Orthopedic Surgery

## 2018-01-18 ENCOUNTER — Other Ambulatory Visit: Payer: Self-pay

## 2018-01-18 ENCOUNTER — Encounter
Admission: RE | Disposition: A | Discharge status: Home Health | Payer: Self-pay | Source: Ambulatory Visit | Attending: Orthopedic Surgery

## 2018-01-18 ENCOUNTER — Encounter: Payer: Self-pay | Admitting: Orthopedic Surgery

## 2018-01-18 ENCOUNTER — Inpatient Hospital Stay: Payer: Medicare Other | Admitting: Anesthesiology

## 2018-01-18 DIAGNOSIS — Z87891 Personal history of nicotine dependence: Secondary | ICD-10-CM | POA: Diagnosis not present

## 2018-01-18 DIAGNOSIS — Z681 Body mass index (BMI) 19 or less, adult: Secondary | ICD-10-CM

## 2018-01-18 DIAGNOSIS — E440 Moderate protein-calorie malnutrition: Secondary | ICD-10-CM | POA: Diagnosis present

## 2018-01-18 DIAGNOSIS — J449 Chronic obstructive pulmonary disease, unspecified: Secondary | ICD-10-CM | POA: Diagnosis present

## 2018-01-18 DIAGNOSIS — Z79899 Other long term (current) drug therapy: Secondary | ICD-10-CM | POA: Diagnosis not present

## 2018-01-18 DIAGNOSIS — M1612 Unilateral primary osteoarthritis, left hip: Secondary | ICD-10-CM | POA: Diagnosis present

## 2018-01-18 DIAGNOSIS — Z96641 Presence of right artificial hip joint: Secondary | ICD-10-CM | POA: Diagnosis present

## 2018-01-18 DIAGNOSIS — Z96649 Presence of unspecified artificial hip joint: Secondary | ICD-10-CM

## 2018-01-18 HISTORY — PX: TOTAL HIP ARTHROPLASTY: SHX124

## 2018-01-18 SURGERY — ARTHROPLASTY, HIP, TOTAL,POSTERIOR APPROACH
Anesthesia: Spinal | Laterality: Left

## 2018-01-18 MED ORDER — ACETAMINOPHEN 10 MG/ML IV SOLN
1000.0000 mg | Freq: Four times a day (QID) | INTRAVENOUS | Status: AC
  Administered 2018-01-18 – 2018-01-19 (×4): 1000 mg via INTRAVENOUS
  Filled 2018-01-18 (×4): qty 100

## 2018-01-18 MED ORDER — CELECOXIB 200 MG PO CAPS
400.0000 mg | ORAL_CAPSULE | Freq: Once | ORAL | Status: AC
  Administered 2018-01-18: 400 mg via ORAL

## 2018-01-18 MED ORDER — MAGNESIUM HYDROXIDE 400 MG/5ML PO SUSP
30.0000 mL | Freq: Every day | ORAL | Status: DC
  Administered 2018-01-20: 30 mL via ORAL
  Filled 2018-01-18 (×3): qty 30

## 2018-01-18 MED ORDER — CEFAZOLIN SODIUM-DEXTROSE 2-4 GM/100ML-% IV SOLN
INTRAVENOUS | Status: AC
  Filled 2018-01-18: qty 100

## 2018-01-18 MED ORDER — CHLORHEXIDINE GLUCONATE 4 % EX LIQD: 60.0000 mL | Freq: Once | CUTANEOUS | Status: DC

## 2018-01-18 MED ORDER — PANTOPRAZOLE SODIUM 40 MG PO TBEC
40.0000 mg | DELAYED_RELEASE_TABLET | Freq: Two times a day (BID) | ORAL | Status: DC
  Administered 2018-01-18 – 2018-01-20 (×4): 40 mg via ORAL
  Filled 2018-01-18 (×4): qty 1

## 2018-01-18 MED ORDER — PROPOFOL 10 MG/ML IV BOLUS
INTRAVENOUS | Status: DC | PRN
  Administered 2018-01-18: 20 mg via INTRAVENOUS

## 2018-01-18 MED ORDER — CLINDAMYCIN PHOSPHATE 600 MG/50ML IV SOLN
600.0000 mg | Freq: Four times a day (QID) | INTRAVENOUS | Status: AC
  Administered 2018-01-18 – 2018-01-19 (×4): 600 mg via INTRAVENOUS
  Filled 2018-01-18 (×4): qty 50

## 2018-01-18 MED ORDER — SODIUM CHLORIDE 0.9 % IV SOLN
INTRAVENOUS | Status: DC | PRN
  Administered 2018-01-18: 20 ug/min via INTRAVENOUS

## 2018-01-18 MED ORDER — METOCLOPRAMIDE HCL 5 MG/ML IJ SOLN: 5.0000 mg | Freq: Three times a day (TID) | INTRAMUSCULAR | Status: DC | PRN

## 2018-01-18 MED ORDER — CLONAZEPAM 0.5 MG PO TABS
0.2500 mg | ORAL_TABLET | Freq: Every day | ORAL | Status: DC
  Administered 2018-01-18 – 2018-01-20 (×3): 0.25 mg via ORAL
  Filled 2018-01-18 (×3): qty 1

## 2018-01-18 MED ORDER — CALCIUM CARBONATE-VITAMIN D 500-200 MG-UNIT PO TABS
1.0000 | ORAL_TABLET | ORAL | Status: DC
  Filled 2018-01-18: qty 1

## 2018-01-18 MED ORDER — MIDAZOLAM HCL 2 MG/2ML IJ SOLN
INTRAMUSCULAR | Status: AC
  Filled 2018-01-18: qty 2

## 2018-01-18 MED ORDER — CELECOXIB 200 MG PO CAPS
ORAL_CAPSULE | ORAL | Status: AC
  Administered 2018-01-18: 400 mg via ORAL
  Filled 2018-01-18: qty 2

## 2018-01-18 MED ORDER — GABAPENTIN 300 MG PO CAPS
300.0000 mg | ORAL_CAPSULE | Freq: Once | ORAL | Status: AC
  Administered 2018-01-18: 300 mg via ORAL

## 2018-01-18 MED ORDER — FENTANYL CITRATE (PF) 100 MCG/2ML IJ SOLN
INTRAMUSCULAR | Status: AC
  Filled 2018-01-18: qty 2

## 2018-01-18 MED ORDER — DEXAMETHASONE SODIUM PHOSPHATE 10 MG/ML IJ SOLN
INTRAMUSCULAR | Status: AC
  Administered 2018-01-18: 8 mg via INTRAVENOUS
  Filled 2018-01-18: qty 1

## 2018-01-18 MED ORDER — EPHEDRINE SULFATE 50 MG/ML IJ SOLN
INTRAMUSCULAR | Status: AC
  Filled 2018-01-18: qty 1

## 2018-01-18 MED ORDER — ONDANSETRON HCL 4 MG/2ML IJ SOLN: 4.0000 mg | Freq: Four times a day (QID) | INTRAMUSCULAR | Status: DC | PRN

## 2018-01-18 MED ORDER — GABAPENTIN 300 MG PO CAPS
ORAL_CAPSULE | ORAL | Status: AC
  Administered 2018-01-18: 300 mg via ORAL
  Filled 2018-01-18: qty 1

## 2018-01-18 MED ORDER — ADULT MULTIVITAMIN W/MINERALS CH
1.0000 | ORAL_TABLET | Freq: Every day | ORAL | Status: DC
  Filled 2018-01-18 (×2): qty 1

## 2018-01-18 MED ORDER — ENOXAPARIN SODIUM 30 MG/0.3ML ~~LOC~~ SOLN
30.0000 mg | Freq: Two times a day (BID) | SUBCUTANEOUS | Status: DC
  Administered 2018-01-19 – 2018-01-20 (×3): 30 mg via SUBCUTANEOUS
  Filled 2018-01-18 (×3): qty 0.3

## 2018-01-18 MED ORDER — ONDANSETRON HCL 4 MG PO TABS: 4.0000 mg | ORAL_TABLET | Freq: Four times a day (QID) | ORAL | Status: DC | PRN

## 2018-01-18 MED ORDER — BUPIVACAINE HCL (PF) 0.5 % IJ SOLN
INTRAMUSCULAR | Status: DC | PRN
  Administered 2018-01-18: 3 mL

## 2018-01-18 MED ORDER — FENTANYL CITRATE (PF) 100 MCG/2ML IJ SOLN
INTRAMUSCULAR | Status: DC | PRN
  Administered 2018-01-18: 50 ug via INTRAVENOUS

## 2018-01-18 MED ORDER — TRANEXAMIC ACID-NACL 1000-0.7 MG/100ML-% IV SOLN
1000.0000 mg | Freq: Once | INTRAVENOUS | Status: AC
  Administered 2018-01-18: 1000 mg via INTRAVENOUS
  Filled 2018-01-18: qty 100

## 2018-01-18 MED ORDER — LIDOCAINE HCL (PF) 2 % IJ SOLN
INTRAMUSCULAR | Status: AC
  Filled 2018-01-18: qty 10

## 2018-01-18 MED ORDER — PROPOFOL 500 MG/50ML IV EMUL
INTRAVENOUS | Status: AC
  Filled 2018-01-18: qty 50

## 2018-01-18 MED ORDER — METOCLOPRAMIDE HCL 10 MG PO TABS: 5.0000 mg | ORAL_TABLET | Freq: Three times a day (TID) | ORAL | Status: DC | PRN

## 2018-01-18 MED ORDER — POLYVINYL ALCOHOL 1.4 % OP SOLN
1.0000 [drp] | Freq: Every day | OPHTHALMIC | Status: DC | PRN
  Filled 2018-01-18: qty 15

## 2018-01-18 MED ORDER — OXYCODONE HCL 5 MG PO TABS
5.0000 mg | ORAL_TABLET | ORAL | Status: DC | PRN
  Administered 2018-01-18: 5 mg via ORAL
  Filled 2018-01-18: qty 1

## 2018-01-18 MED ORDER — SODIUM CHLORIDE 0.9 % IV BOLUS
500.0000 mL | Freq: Once | INTRAVENOUS | Status: AC
  Administered 2018-01-18: 500 mL via INTRAVENOUS

## 2018-01-18 MED ORDER — MENTHOL 3 MG MT LOZG
1.0000 | LOZENGE | OROMUCOSAL | Status: DC | PRN
  Filled 2018-01-18: qty 9

## 2018-01-18 MED ORDER — FERROUS SULFATE 325 (65 FE) MG PO TABS
325.0000 mg | ORAL_TABLET | Freq: Two times a day (BID) | ORAL | Status: DC
  Administered 2018-01-18 – 2018-01-20 (×4): 325 mg via ORAL
  Filled 2018-01-18 (×4): qty 1

## 2018-01-18 MED ORDER — SODIUM CHLORIDE 0.9 % IV SOLN
INTRAVENOUS | Status: DC
  Administered 2018-01-18 (×2): via INTRAVENOUS

## 2018-01-18 MED ORDER — LORATADINE 10 MG PO TABS: 10.0000 mg | ORAL_TABLET | Freq: Every day | ORAL | Status: DC | PRN

## 2018-01-18 MED ORDER — CELECOXIB 200 MG PO CAPS
200.0000 mg | ORAL_CAPSULE | Freq: Two times a day (BID) | ORAL | Status: DC
  Administered 2018-01-18 – 2018-01-20 (×4): 200 mg via ORAL
  Filled 2018-01-18 (×4): qty 1

## 2018-01-18 MED ORDER — METOCLOPRAMIDE HCL 10 MG PO TABS
10.0000 mg | ORAL_TABLET | Freq: Three times a day (TID) | ORAL | Status: AC
  Administered 2018-01-18 (×3): 10 mg via ORAL
  Filled 2018-01-18 (×5): qty 1

## 2018-01-18 MED ORDER — ACETAMINOPHEN 325 MG PO TABS: 325.0000 mg | ORAL_TABLET | Freq: Four times a day (QID) | ORAL | Status: DC | PRN

## 2018-01-18 MED ORDER — LACTATED RINGERS IV SOLN
INTRAVENOUS | Status: DC
  Administered 2018-01-18: 07:00:00 via INTRAVENOUS

## 2018-01-18 MED ORDER — ACETAMINOPHEN 10 MG/ML IV SOLN
INTRAVENOUS | Status: DC | PRN
  Administered 2018-01-18: 1000 mg via INTRAVENOUS

## 2018-01-18 MED ORDER — PHENOL 1.4 % MT LIQD
1.0000 | OROMUCOSAL | Status: DC | PRN
  Filled 2018-01-18: qty 177

## 2018-01-18 MED ORDER — LIDOCAINE HCL (CARDIAC) PF 100 MG/5ML IV SOSY
PREFILLED_SYRINGE | INTRAVENOUS | Status: DC | PRN
  Administered 2018-01-18: 60 mg via INTRAVENOUS

## 2018-01-18 MED ORDER — SENNOSIDES-DOCUSATE SODIUM 8.6-50 MG PO TABS
1.0000 | ORAL_TABLET | Freq: Two times a day (BID) | ORAL | Status: DC
  Administered 2018-01-18 – 2018-01-20 (×4): 1 via ORAL
  Filled 2018-01-18 (×4): qty 1

## 2018-01-18 MED ORDER — GABAPENTIN 300 MG PO CAPS
300.0000 mg | ORAL_CAPSULE | Freq: Every day | ORAL | Status: DC
  Administered 2018-01-18 – 2018-01-19 (×2): 300 mg via ORAL
  Filled 2018-01-18 (×2): qty 1

## 2018-01-18 MED ORDER — BISACODYL 10 MG RE SUPP: 10.0000 mg | Freq: Every day | RECTAL | Status: DC | PRN

## 2018-01-18 MED ORDER — DIPHENHYDRAMINE HCL 12.5 MG/5ML PO ELIX: 12.5000 mg | ORAL_SOLUTION | ORAL | Status: DC | PRN

## 2018-01-18 MED ORDER — ALUM & MAG HYDROXIDE-SIMETH 200-200-20 MG/5ML PO SUSP: 30.0000 mL | ORAL | Status: DC | PRN

## 2018-01-18 MED ORDER — PROPOFOL 500 MG/50ML IV EMUL
INTRAVENOUS | Status: DC | PRN
  Administered 2018-01-18: 50 ug/kg/min via INTRAVENOUS

## 2018-01-18 MED ORDER — ACETAMINOPHEN 10 MG/ML IV SOLN
INTRAVENOUS | Status: AC
  Filled 2018-01-18: qty 100

## 2018-01-18 MED ORDER — OXYCODONE HCL 5 MG PO TABS: 10.0000 mg | ORAL_TABLET | ORAL | Status: DC | PRN

## 2018-01-18 MED ORDER — MIDAZOLAM HCL 5 MG/5ML IJ SOLN
INTRAMUSCULAR | Status: DC | PRN
  Administered 2018-01-18 (×2): 1 mg via INTRAVENOUS

## 2018-01-18 MED ORDER — PHENYLEPHRINE HCL 10 MG/ML IJ SOLN
INTRAMUSCULAR | Status: AC
  Filled 2018-01-18: qty 1

## 2018-01-18 MED ORDER — TRAMADOL HCL 50 MG PO TABS
50.0000 mg | ORAL_TABLET | ORAL | Status: DC | PRN
  Administered 2018-01-18 – 2018-01-20 (×5): 50 mg via ORAL
  Filled 2018-01-18 (×5): qty 1

## 2018-01-18 MED ORDER — FLEET ENEMA 7-19 GM/118ML RE ENEM: 1.0000 | ENEMA | Freq: Once | RECTAL | Status: DC | PRN

## 2018-01-18 MED ORDER — FAMOTIDINE 20 MG PO TABS
20.0000 mg | ORAL_TABLET | Freq: Once | ORAL | Status: AC
  Administered 2018-01-18: 20 mg via ORAL

## 2018-01-18 MED ORDER — CALCIUM CARBONATE ANTACID 500 MG PO CHEW: 750.0000 mg | CHEWABLE_TABLET | Freq: Every day | ORAL | Status: DC | PRN

## 2018-01-18 MED ORDER — FAMOTIDINE 20 MG PO TABS
ORAL_TABLET | ORAL | Status: AC
  Administered 2018-01-18: 20 mg via ORAL
  Filled 2018-01-18: qty 1

## 2018-01-18 MED ORDER — ONDANSETRON HCL 4 MG/2ML IJ SOLN
INTRAMUSCULAR | Status: AC
  Filled 2018-01-18: qty 2

## 2018-01-18 MED ORDER — HYDROMORPHONE HCL 1 MG/ML IJ SOLN: 0.5000 mg | INTRAMUSCULAR | Status: DC | PRN

## 2018-01-18 MED ORDER — DEXAMETHASONE SODIUM PHOSPHATE 10 MG/ML IJ SOLN
8.0000 mg | Freq: Once | INTRAMUSCULAR | Status: AC
  Administered 2018-01-18: 8 mg via INTRAVENOUS

## 2018-01-18 MED ORDER — BUPIVACAINE HCL (PF) 0.5 % IJ SOLN
INTRAMUSCULAR | Status: AC
  Filled 2018-01-18: qty 10

## 2018-01-18 MED ORDER — NEOMYCIN-POLYMYXIN B GU 40-200000 IR SOLN
Status: DC | PRN
  Administered 2018-01-18: 14 mL

## 2018-01-18 SURGICAL SUPPLY — 58 items
BLADE DRUM FLTD (BLADE) ×3 IMPLANT
BLADE SAW 90X25X1.19 OSCILLAT (BLADE) ×3 IMPLANT
CANISTER SUCT 1200ML W/VALVE (MISCELLANEOUS) IMPLANT
CANISTER SUCT 3000ML PPV (MISCELLANEOUS) IMPLANT
CARTRIDGE OIL MAESTRO DRILL (MISCELLANEOUS) ×1 IMPLANT
COVER WAND RF STERILE (DRAPES) ×3 IMPLANT
DIFFUSER DRILL AIR PNEUMATIC (MISCELLANEOUS) ×3 IMPLANT
DRAPE INCISE IOBAN 66X60 STRL (DRAPES) ×3 IMPLANT
DRAPE SHEET LG 3/4 BI-LAMINATE (DRAPES) ×3 IMPLANT
DRSG DERMACEA 8X12 NADH (GAUZE/BANDAGES/DRESSINGS) ×3 IMPLANT
DRSG OPSITE POSTOP 4X12 (GAUZE/BANDAGES/DRESSINGS) ×3 IMPLANT
DRSG OPSITE POSTOP 4X14 (GAUZE/BANDAGES/DRESSINGS) IMPLANT
DRSG TEGADERM 4X4.75 (GAUZE/BANDAGES/DRESSINGS) ×3 IMPLANT
DURAPREP 26ML APPLICATOR (WOUND CARE) ×6 IMPLANT
ELECT BLADE 6.5 EXT (BLADE) ×3 IMPLANT
ELECT CAUTERY BLADE 6.4 (BLADE) ×3 IMPLANT
GLOVE BIOGEL M STRL SZ7.5 (GLOVE) ×6 IMPLANT
GLOVE BIOGEL PI IND STRL 9 (GLOVE) ×3 IMPLANT
GLOVE BIOGEL PI INDICATOR 9 (GLOVE) ×6
GLOVE INDICATOR 8.0 STRL GRN (GLOVE) ×12 IMPLANT
GLOVE SURG SYN 9.0  PF PI (GLOVE) ×6
GLOVE SURG SYN 9.0 PF PI (GLOVE) ×3 IMPLANT
GOWN STRL REUS W/ TWL LRG LVL3 (GOWN DISPOSABLE) ×4 IMPLANT
GOWN STRL REUS W/TWL 2XL LVL3 (GOWN DISPOSABLE) ×3 IMPLANT
GOWN STRL REUS W/TWL LRG LVL3 (GOWN DISPOSABLE) ×8
HEAD M SROM 36MM PLUS 1.5 (Hips) ×1 IMPLANT
HEMOVAC 400CC 10FR (MISCELLANEOUS) ×3 IMPLANT
HOLDER FOLEY CATH W/STRAP (MISCELLANEOUS) ×3 IMPLANT
HOOD PEEL AWAY FLYTE STAYCOOL (MISCELLANEOUS) ×9 IMPLANT
KIT TURNOVER KIT A (KITS) ×3 IMPLANT
LINER MARATHON 4MM 10DEG 36MM (Hips) ×3 IMPLANT
NDL SAFETY ECLIPSE 18X1.5 (NEEDLE) ×1 IMPLANT
NEEDLE HYPO 18GX1.5 SHARP (NEEDLE) ×2
NS IRRIG 500ML POUR BTL (IV SOLUTION) ×3 IMPLANT
OIL CARTRIDGE MAESTRO DRILL (MISCELLANEOUS) ×3
PACK HIP PROSTHESIS (MISCELLANEOUS) ×3 IMPLANT
PIN SECT CUP 56MM (Hips) ×3 IMPLANT
PIN STEIN THRED 5/32 (Pin) ×3 IMPLANT
PULSAVAC PLUS IRRIG FAN TIP (DISPOSABLE) ×3
SOL .9 NS 3000ML IRR  AL (IV SOLUTION) ×2
SOL .9 NS 3000ML IRR UROMATIC (IV SOLUTION) ×1 IMPLANT
SOL PREP PVP 2OZ (MISCELLANEOUS) ×3
SOLUTION PREP PVP 2OZ (MISCELLANEOUS) ×1 IMPLANT
SPONGE DRAIN TRACH 4X4 STRL 2S (GAUZE/BANDAGES/DRESSINGS) ×3 IMPLANT
SROM M HEAD 36MM PLUS 1.5 (Hips) ×3 IMPLANT
STAPLER SKIN PROX 35W (STAPLE) ×3 IMPLANT
STEM FEM CMNTLSS SM AML 13.5 (Hips) ×3 IMPLANT
SUT ETHIBOND #5 BRAIDED 30INL (SUTURE) ×3 IMPLANT
SUT VIC AB 0 CT1 36 (SUTURE) ×3 IMPLANT
SUT VIC AB 1 CT1 36 (SUTURE) ×6 IMPLANT
SUT VIC AB 2-0 CT1 27 (SUTURE) ×2
SUT VIC AB 2-0 CT1 TAPERPNT 27 (SUTURE) ×1 IMPLANT
SYR 20CC LL (SYRINGE) ×3 IMPLANT
TAPE CLOTH 3X10 WHT NS LF (GAUZE/BANDAGES/DRESSINGS) ×3 IMPLANT
TAPE TRANSPORE STRL 2 31045 (GAUZE/BANDAGES/DRESSINGS) ×3 IMPLANT
TIP FAN IRRIG PULSAVAC PLUS (DISPOSABLE) ×1 IMPLANT
TOWEL OR 17X26 4PK STRL BLUE (TOWEL DISPOSABLE) ×3 IMPLANT
TRAY FOLEY MTR SLVR 16FR STAT (SET/KITS/TRAYS/PACK) ×3 IMPLANT

## 2018-01-18 NOTE — Op Note (Signed)
OPERATIVE NOTE  DATE OF SURGERY:  01/18/2018  PATIENT NAME:  Monica [name]   DOB: Feb 17, 1941  MRN: [id number]  PRE-OPERATIVE DIAGNOSIS: Degenerative arthrosis of the left hip, primary  POST-OPERATIVE DIAGNOSIS:  Same  PROCEDURE:  Left total hip arthroplasty  SURGEON:  Jena Gauss. M.D.  ASSISTANT:  Van Clines, PA (present and scrubbed throughout the case, critical for assistance with exposure, retraction, instrumentation, and closure)  ANESTHESIA: spinal  ESTIMATED BLOOD LOSS: 50 mL  FLUIDS REPLACED: 900 mL of crystalloid  DRAINS: 2 medium drains to a Hemovac reservoir  IMPLANTS UTILIZED: DePuy 13.5 mm small stature AML femoral stem, 56 mm OD Pinnacle 100 acetabular component, neutral Pinnacle Marathon polyethylene insert, and a 36 mm M-SPEC was 1.5 mm hip ball  INDICATIONS FOR SURGERY: Monica [name] is a 77 y.o. year old female with a long history of progressive hip and groin  pain. X-rays demonstrated severe degenerative changes. The patient had not seen any significant improvement despite conservative nonsurgical intervention. After discussion of the risks and benefits of surgical intervention, the patient expressed understanding of the risks benefits and agree with plans for total hip arthroplasty.   The risks, benefits, and alternatives were discussed at length including but not limited to the risks of infection, bleeding, nerve injury, stiffness, blood clots, the need for revision surgery, limb length inequality, dislocation, cardiopulmonary complications, among others, and they were willing to proceed.  PROCEDURE IN DETAIL: The patient was brought into the operating room and, after adequate spinal anesthesia was achieved, the patient was placed in a right lateral decubitus position. Axillary roll was placed and all bony prominences were well-padded. The patient's left hip was cleaned and prepped with alcohol and DuraPrep and draped in the usual sterile fashion. A "timeout" was  performed as per usual protocol. A lateral curvilinear incision was made gently curving towards the posterior superior iliac spine. The IT band was incised in line with the skin incision and the fibers of the gluteus maximus were split in line. The piriformis tendon was identified, skeletonized, and incised at its insertion to the proximal femur and reflected posteriorly. A T type posterior capsulotomy was performed. Prior to dislocation of the femoral head, a threaded Steinmann pin was inserted through a separate stab incision into the pelvis superior to the acetabulum and bent in the form of a stylus so as to assess limb length and hip offset throughout the procedure. The femoral head was then dislocated posteriorly. Inspection of the femoral head demonstrated severe degenerative changes with full-thickness loss of articular cartilage. The femoral neck cut was performed using an oscillating saw. The anterior capsule was elevated off of the femoral neck using a periosteal elevator. Attention was then directed to the acetabulum. The remnant of the labrum was excised using electrocautery. Inspection of the acetabulum also demonstrated significant degenerative changes. The acetabulum was reamed in sequential fashion up to a 55 mm diameter. Good punctate bleeding bone was encountered. A 56 mm Pinnacle 100 acetabular component was positioned and impacted into place. Good scratch fit was appreciated. A neutral polyethylene trial was inserted.  Attention was then directed to the proximal femur. A hole for reaming of the proximal femoral canal was created using a high-speed burr. The femoral canal was reamed in sequential fashion up to a 13 mm diameter. This allowed for approximately 6 cm of scratch fit. Serial broaches were inserted up to a 13.5 mm small stature femoral broach. Calcar region was planed and a trial reduction was performed using  a 36 mm hip ball with a +1.5 mm neck length. Good equalization of limb  lengths and hip offset was appreciated and excellent stability was noted both anteriorly and posteriorly. Trial components were removed. The acetabular shell was irrigated with copious amounts of normal saline with antibiotic solution and suctioned dry. A neutral Pinnacle Marathon polyethylene insert was positioned and impacted into place. Next, a 13.5 mm small stature AML femoral stem was positioned and impacted into place. Excellent scratch fit was appreciated. A trial reduction was again performed with a 36 mm hip ball with a +1.5 mm neck length. Again, good equalization of limb lengths was appreciated and excellent stability appreciated both anteriorly and posteriorly. The hip was then dislocated and the trial hip ball was removed. The Morse taper was cleaned and dried. A 36 mm M-SPEC hip ball with a +1.5 mm neck length was placed on the trunnion and impacted into place. The hip was then reduced and placed through range of motion. Excellent stability was appreciated both anteriorly and posteriorly.  The wound was irrigated with copious amounts of normal saline with antibiotic solution and suctioned dry. Good hemostasis was appreciated. The posterior capsulotomy was repaired using #5 Ethibond. Piriformis tendon was reapproximated to the undersurface of the gluteus medius tendon using #5 Ethibond. Two medium drains were placed in the wound bed and brought out through separate stab incisions to be attached to a Hemovac reservoir. The IT band was reapproximated using interrupted sutures of #1 Vicryl. Subcutaneous tissue was approximated using first #0 Vicryl followed by #2-0 Vicryl. The skin was closed with skin staples.  The patient tolerated the procedure well and was transported to the recovery room in stable condition.   Jena Gauss., M.D.

## 2018-01-18 NOTE — Progress Notes (Signed)
PT Cancellation Note  Patient Details Name: Monica [name] MRN: [id number] DOB: October 22, 1940   Cancelled Treatment:    Reason Eval/Treat Not Completed: Medical issues which prohibited therapy.  Pt does not yet have return of sensation to LEs. Will hold PT until pt able to safely participate in PT evaluation.   Encarnacion Chu PT, DPT 01/18/2018, 2:16 PM

## 2018-01-18 NOTE — Anesthesia Procedure Notes (Signed)
Spinal  Patient location during procedure: OR Start time: 01/18/2018 7:23 AM End time: 01/18/2018 7:28 AM Staffing Anesthesiologist: Berdine Addison, MD Resident/CRNA: Dava Najjar, CRNA Other anesthesia staff: Ancil Boozer, RN Performed: other anesthesia staff  Preanesthetic Checklist Completed: patient identified, site marked, surgical consent, pre-op evaluation, timeout performed, IV checked, risks and benefits discussed and monitors and equipment checked Spinal Block Patient position: sitting Prep: Betadine and site prepped and draped Patient monitoring: heart rate, continuous pulse ox and blood pressure Approach: midline Location: L3-4 Injection technique: single-shot Needle Needle type: Pencan  Needle gauge: 24 G Needle length: 10 cm Assessment Sensory level: T6

## 2018-01-18 NOTE — H&P (Signed)
The patient has been re-examined, and the chart reviewed, and there have been no interval changes to the documented history and physical.    The risks, benefits, and alternatives have been discussed at length. The patient expressed understanding of the risks benefits and agreed with plans for surgical intervention.  James P. Hooten, Jr. M.D.    

## 2018-01-18 NOTE — Evaluation (Signed)
Physical Therapy Evaluation Patient Details Name: Monica [name] MRN: [id number] DOB: 04-20-1940 Today's Date: 01/18/2018   History of Present Illness  Pt is a 77 y/o F s/p L THA.  Pt's PMH includes COPD.    Clinical Impression  Pt is s/p L THA resulting in the deficits listed below (see PT Problem List). Monica [name] was very pleasant and agreeable to work with therapy.  She completed therapeutic exercises in supine.  Pt was able to recall 2/3 hip precautions at the end of the session after education and handout provided.  Given pt's current mobility status, recommending SNF at d/c. BP taken at start of session in supine: 92/62, taken again after therapeutic exercises: 87/58.  RN notified. Deferred OOB activity due to hypotension.  Pt will benefit from skilled PT to increase their independence and safety with mobility to allow discharge to the venue listed below.     Follow Up Recommendations SNF    Equipment Recommendations  None recommended by PT    Recommendations for Other Services OT consult     Precautions / Restrictions Precautions Precautions: Fall;Posterior Hip Precaution Booklet Issued: Yes (comment) Precaution Comments: Pt educated in posterior precautions and was able to recall 2/3 at the end of the session.  Handout provided to the pt.  Restrictions Weight Bearing Restrictions: Yes LLE Weight Bearing: Weight bearing as tolerated      Mobility  Bed Mobility               General bed mobility comments: Not safe to attempt at this time due to hypotension  Transfers                    Ambulation/Gait                Stairs            Wheelchair Mobility    Modified Rankin (Stroke Patients Only)       Balance                                             Pertinent Vitals/Pain Pain Assessment: Faces Faces Pain Scale: Hurts little more Pain Location: L groin, lateral knee Pain Descriptors / Indicators:  Aching;Discomfort Pain Intervention(s): Limited activity within patient's tolerance;Monitored during session;Premedicated before session    Home Living Family/patient expects to be discharged to:: Private residence Living Arrangements: Alone Available Help at Discharge: Family;Available 24 hours/day(granddaughter who is a Engineer, site can stay 24/7) Type of Home: House Home Access: Stairs to enter Entrance Stairs-Rails: (no rail at front steps, R rail in garage) Secretary/administrator of Steps: 1(1 step at front without railing,3 steps in garage w/ R rail) Home Layout: Two level;Able to live on main level with bedroom/bathroom(bonus room upstairs) Home Equipment: Grab bars - tub/shower;Bedside commode;Walker - 2 wheels(grab bars periodically inside home)      Prior Function Level of Independence: Independent with assistive device(s)         Comments: Ambulating with cane in community, RW in home.  Pt still driving.  Takes sponge bath. Ind with dressing.      Hand Dominance   Dominant Hand: Right    Extremity/Trunk Assessment   Upper Extremity Assessment Upper Extremity Assessment: Defer to OT evaluation    Lower Extremity Assessment Lower Extremity Assessment: LLE deficits/detail LLE Deficits / Details: Strength grossly at least 3/5, limited  assessment due to pain LLE: Unable to fully assess due to pain       Communication   Communication: No difficulties  Cognition Arousal/Alertness: Awake/alert Behavior During Therapy: WFL for tasks assessed/performed Overall Cognitive Status: Within Functional Limits for tasks assessed                                        General Comments General comments (skin integrity, edema, etc.): BP taken at start of session in supine: 92/62, taken again after therapeutic exercises: 87/58.  RN notified.     Exercises Total Joint Exercises Ankle Circles/Pumps: AROM;Both;10 reps;Supine Quad Sets:  Strengthening;Both;10 reps;Supine Short Arc Quad: Strengthening;Left;10 reps;Supine Hip ABduction/ADduction: AROM;AAROM;Left;10 reps;Supine   Assessment/Plan    PT Assessment Patient needs continued PT services  PT Problem List Decreased strength;Decreased range of motion;Decreased activity tolerance;Decreased balance;Decreased mobility;Decreased knowledge of use of DME;Decreased safety awareness;Pain;Decreased knowledge of precautions       PT Treatment Interventions DME instruction;Gait training;Stair training;Functional mobility training;Therapeutic activities;Therapeutic exercise;Balance training;Neuromuscular re-education;Patient/family education;Wheelchair mobility training;Modalities    PT Goals (Current goals can be found in the Care Plan section)  Acute Rehab PT Goals Patient Stated Goal: to go home at d/c and return to PLOF PT Goal Formulation: With patient Time For Goal Achievement: 02/01/18 Potential to Achieve Goals: Good    Frequency BID   Barriers to discharge Inaccessible home environment At least 1 step to enter home    Co-evaluation               AM-PAC PT "6 Clicks" Daily Activity  Outcome Measure Difficulty turning over in bed (including adjusting bedclothes, sheets and blankets)?: Unable Difficulty moving from lying on back to sitting on the side of the bed? : Unable Difficulty sitting down on and standing up from a chair with arms (e.g., wheelchair, bedside commode, etc,.)?: Unable Help needed moving to and from a bed to chair (including a wheelchair)?: Total Help needed walking in hospital room?: Total Help needed climbing 3-5 steps with a railing? : Total 6 Click Score: 6    End of Session   Activity Tolerance: Treatment limited secondary to medical complications (Comment)(limited due to hypotension) Patient left: in bed;with call bell/phone within reach;with bed alarm set;with SCD's reapplied;with family/visitor present Nurse Communication:  Mobility status;Other (comment)(BP readings) PT Visit Diagnosis: Pain;Muscle weakness (generalized) (M62.81);Difficulty in walking, not elsewhere classified (R26.2) Pain - Right/Left: Left Pain - part of body: Hip    Time: 1610-9604 PT Time Calculation (min) (ACUTE ONLY): 41 min   Charges:   PT Evaluation $PT Eval Moderate Complexity: 1 Mod PT Treatments $Therapeutic Exercise: 23-37 mins        Encarnacion Chu PT, DPT 01/18/2018, 4:27 PM

## 2018-01-18 NOTE — [place] FL2 (Signed)
  [place] MEDICAID FL2 LEVEL OF CARE SCREENING TOOL     IDENTIFICATION  Patient Name: Monica [name] Birthdate: [birth year] Sex: female Admission Date (Current Location): 01/18/2018  [place] and [place] Number:  Chiropodist and Address:  [facility], [address], [place], [place] [address]      Provider Number: [id number]  Attending Physician Name and Address:  Donato Heinz, MD  Relative Name and Phone Number:       Current Level of Care: Hospital Recommended Level of Care: Skilled Nursing Facility Prior Approval Number:    Date Approved/Denied:   PASRR Number: [id number])  Discharge Plan: SNF    Current Diagnoses: Patient Active Problem List   Diagnosis Date Noted  . 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 Degenerative joint disease (DJD) of hip 10/09/2014  . History of total right hip replacement 10/09/2014  . Anxiety 07/14/2013    Orientation RESPIRATION BLADDER Height & Weight     Self, Time, Situation, Place  Normal Continent Weight: 116 lb 7.2 oz (52.8 kg) Height:  5' 4.5" (163.8 cm)  BEHAVIORAL SYMPTOMS/MOOD NEUROLOGICAL BOWEL NUTRITION STATUS      Continent Diet(Diet: Regular )  AMBULATORY STATUS COMMUNICATION OF NEEDS Skin   Extensive Assist Verbally Surgical wounds(Incision: Left Hip. )                       Personal Care Assistance Level of Assistance  Bathing, Feeding, Dressing Bathing Assistance: Limited assistance Feeding assistance: Independent Dressing Assistance: Limited assistance     Functional Limitations Info  Sight, Hearing, Speech Sight Info: Adequate Hearing Info: Adequate Speech Info: Adequate    SPECIAL CARE FACTORS FREQUENCY  PT (By licensed PT), OT (By licensed OT)     PT Frequency: (5) OT Frequency: (5)            Contractures      Additional Factors Info  Code Status, Allergies Code Status Info: (Full Code. ) Allergies Info: (Amoxicillin)            Current Medications (01/18/2018):  This is the current hospital active medication list No current facility-administered medications for this encounter.      Discharge Medications: Please see discharge summary for a list of discharge medications.  Relevant Imaging Results:  Relevant Lab Results:   Additional Information (SSN: [id number])  Riannah Stagner, Darleen Crocker, LCSW

## 2018-01-18 NOTE — Anesthesia Post-op Follow-up Note (Signed)
Anesthesia QCDR form completed.        

## 2018-01-18 NOTE — Transfer of Care (Signed)
Immediate Anesthesia Transfer of Care Note  Patient: Monica [name]  Procedure(s) Performed: TOTAL HIP ARTHROPLASTY (Left )  Patient Location: PACU  Anesthesia Type:Spinal  Level of Consciousness: awake, alert , oriented and patient cooperative  Airway & Oxygen Therapy: Patient Spontanous Breathing  Post-op Assessment: Report given to RN and Post -op Vital signs reviewed and stable  Post vital signs: Reviewed and stable  Last Vitals:  Vitals Value Taken Time  BP 88/54 01/18/2018 10:34 AM  Temp    Pulse 83 01/18/2018 10:36 AM  Resp 20 01/18/2018 10:36 AM  SpO2 99 % 01/18/2018 10:36 AM  Vitals shown include unvalidated device data.  Last Pain:  Vitals:   01/18/18 0620  TempSrc: Tympanic  PainSc: 5          Complications: No apparent anesthesia complications

## 2018-01-18 NOTE — Anesthesia Preprocedure Evaluation (Addendum)
Anesthesia Evaluation  Patient identified by MRN, date of birth, ID band Patient awake    Reviewed: Allergy & Precautions, NPO status , Patient's Chart, lab work & pertinent test results, reviewed documented beta blocker date and time   Airway Mallampati: II  TM Distance: >3 FB     Dental  (+) Chipped   Pulmonary COPD, former smoker,           Cardiovascular      Neuro/Psych Anxiety    GI/Hepatic   Endo/Other    Renal/GU      Musculoskeletal  (+) Arthritis ,   Abdominal   Peds  Hematology   Anesthesia Other Findings EKG reviewed and ok.  Reproductive/Obstetrics                            Anesthesia Physical Anesthesia Plan  ASA: III  Anesthesia Plan: Spinal   Post-op Pain Management:    Induction:   PONV Risk Score and Plan:   Airway Management Planned:   Additional Equipment:   Intra-op Plan:   Post-operative Plan:   Informed Consent: I have reviewed the patients History and Physical, chart, labs and discussed the procedure including the risks, benefits and alternatives for the proposed anesthesia with the patient or authorized representative who has indicated his/her understanding and acceptance.     Plan Discussed with: CRNA  Anesthesia Plan Comments:         Anesthesia Quick Evaluation

## 2018-01-19 ENCOUNTER — Encounter: Payer: Self-pay | Admitting: Orthopedic Surgery

## 2018-01-19 MED ORDER — TRAMADOL HCL 50 MG PO TABS: 50.0000 mg | ORAL_TABLET | ORAL | 0 refills | Status: AC | PRN

## 2018-01-19 MED ORDER — ENSURE ENLIVE PO LIQD
237.0000 mL | Freq: Two times a day (BID) | ORAL | Status: DC
  Administered 2018-01-19 – 2018-01-20 (×2): 237 mL via ORAL

## 2018-01-19 MED ORDER — OXYCODONE HCL 5 MG PO TABS: 5.0000 mg | ORAL_TABLET | ORAL | 0 refills | Status: AC | PRN

## 2018-01-19 MED ORDER — ENOXAPARIN SODIUM 40 MG/0.4ML ~~LOC~~ SOLN: 40.0000 mg | SUBCUTANEOUS | 0 refills | Status: AC

## 2018-01-19 NOTE — Evaluation (Signed)
Occupational Therapy Evaluation Patient Details Name: Monica [name] MRN: [id number] DOB: December 06, 1940 Today's Date: 01/19/2018    History of Present Illness Pt is a 77 y/o F s/p L THA.  Pt's PMH includes COPD.     Clinical Impression   Pt seen for OT evaluation this date, POD#1 from above surgery. Pt was independent with ADLs and took sponge baths for fear of falling prior to surgery, however using SPC and 2WW for ambulation in home and community due to L hip pain. Pt is eager to return to PLOF with less pain and improved safety and independence. Pt currently requires minimal assist for LB dressing while in seated position due to pain and limited AROM of L hip. Pt able to recall 3/3 posterior total hip precautions at start of session and unable verbalize how to implement during ADL and mobility. Pt instructed in posterior total hip precautions and how to implement self care skills, falls prevention strategies, home/routines modifications, DME/AE for LB bathing and dressing tasks, and compression stocking mgt strategies. Pt would benefit from additional instruction in self care skills and techniques to help maintain precautions with or without assistive devices to support recall and carryover prior to discharge. Recommend HHOT upon discharge.       Follow Up Recommendations  Home health OT    Equipment Recommendations  None recommended by OT    Recommendations for Other Services       Precautions / Restrictions Precautions Precautions: Posterior Hip Precaution Comments: Pt recalled 3/3 posterior hip precautions Restrictions Weight Bearing Restrictions: Yes LLE Weight Bearing: Weight bearing as tolerated      Mobility Bed Mobility               General bed mobility comments: Pt presented in recliner  Transfers Overall transfer level: Needs assistance Equipment used: Rolling walker (2 wheeled) Transfers: Sit to/from Stand Sit to Stand: Min guard              Balance  Overall balance assessment: Needs assistance Sitting-balance support: No upper extremity supported;Feet supported Sitting balance-Leahy Scale: Good     Standing balance support: Single extremity supported;During functional activity Standing balance-Leahy Scale: Poor Standing balance comment: Relies on at least 1UE support for static and dynamic activities                           ADL either performed or assessed with clinical judgement   ADL Overall ADL's : Needs assistance/impaired Eating/Feeding: Independent;Sitting   Grooming: Independent;Sitting   Upper Body Bathing: Sitting;Supervision/ safety Upper Body Bathing Details (indicate cue type and reason): Pt takes sponge bath Lower Body Bathing: Sitting/lateral leans;Minimal assistance;Adhering to hip precautions   Upper Body Dressing : Supervision/safety;Sitting   Lower Body Dressing: Sit to/from stand;With adaptive equipment;Minimal assistance;Adhering to hip precautions Lower Body Dressing Details (indicate cue type and reason): reacher Toilet Transfer: Min guard;RW;Adhering to hip precautions                   Vision Baseline Vision/History: Wears glasses Wears Glasses: At all times Patient Visual Report: No change from baseline       Perception     Praxis      Pertinent Vitals/Pain Pain Assessment: No/denies pain Pain Location: "just a little sore in left hip" Pain Descriptors / Indicators: Sore Pain Intervention(s): Monitored during session;Repositioned;Ice applied     Hand Dominance Right   Extremity/Trunk Assessment Upper Extremity Assessment Upper Extremity Assessment: Overall WFL for tasks  assessed   Lower Extremity Assessment Lower Extremity Assessment: Defer to PT evaluation       Communication Communication Communication: No difficulties   Cognition Arousal/Alertness: Awake/alert Behavior During Therapy: WFL for tasks assessed/performed Overall Cognitive Status: Within  Functional Limits for tasks assessed                                     General Comments  BP taken at start of session in recliner 94/65, taken after sit to stand 87/75, taken after standing for 2 minutes 99/85    Exercises Other Exercises Other Exercises: Pt educated in compression sock mgt including donning/doffing, wear schedule, and positioning. Other Exercises: Pt educated in using AE for LB dressing. Other Exercises: Pt demonstrated understanding of hip precautions during sit to stand transfer. Other Exercises: Pt educated on home environment modifications for increased safety.   Shoulder Instructions      Home Living Family/patient expects to be discharged to:: Private residence Living Arrangements: Alone Available Help at Discharge: Family;Available 24 hours/day Type of Home: House Home Access: Stairs to enter [employer] of Steps: 1 step without rail. Pt will have caregiver help to manipulate step.   Home Layout: Two level;Able to live on main level with bedroom/bathroom     Bathroom Shower/Tub: Producer, television/film/video: Handicapped height     Home Equipment: Grab bars - tub/shower;Bedside commode;Walker - 2 wheels;Cane - single point          Prior Functioning/Environment Level of Independence: Independent with assistive device(s)        Comments: Ambulating with cane in community, RW in home.  Pt still driving.  Takes sponge bath. Ind with dressing.         OT Problem List: Decreased strength;Decreased range of motion;Decreased activity tolerance;Decreased knowledge of use of DME or AE      OT Treatment/Interventions: Self-care/ADL training;DME and/or AE instruction;Therapeutic activities;Therapeutic exercise;Patient/family education    OT Goals(Current goals can be found in the care plan section) Acute Rehab OT Goals Patient Stated Goal: to get back to cleaning house OT Goal Formulation: With patient Time For Goal  Achievement: 02/02/18 Potential to Achieve Goals: Good ADL Goals Pt Will Perform Lower Body Dressing: with supervision;sitting/lateral leans;with adaptive equipment Additional ADL Goal #1: Pt will independently follow 3/3 hip precautions while completing ADL and IADL tasks. Additional ADL Goal #2: Pt will independently educate caregiver with compression sock mgt including donning/doffing, wear schedule and positioning for increased safety.  OT Frequency: Min 1X/week   Barriers to D/C:            Co-evaluation              AM-PAC PT "6 Clicks" Daily Activity     Outcome Measure Help from another person eating meals?: None Help from another person taking care of personal grooming?: None Help from another person toileting, which includes using toliet, bedpan, or urinal?: A Little Help from another person bathing (including washing, rinsing, drying)?: A Little Help from another person to put on and taking off regular upper body clothing?: None Help from another person to put on and taking off regular lower body clothing?: A Little 6 Click Score: 21   End of Session Equipment Utilized During Treatment: Gait belt;Rolling walker  Activity Tolerance: Patient tolerated treatment well Patient left: in chair;with call bell/phone within reach;with chair alarm set(SCD machine was not working correctly. RN aware.)  OT Visit Diagnosis: Other abnormalities of gait and mobility (R26.89)                Time: 1610-9604 OT Time Calculation (min): 43 min Charges:     Gypsy Balsam OTS}  Gypsy Balsam 01/19/2018, 10:36 AM

## 2018-01-19 NOTE — Progress Notes (Signed)
Foley cath removed .

## 2018-01-19 NOTE — Progress Notes (Signed)
Clinical Social Worker (CSW) received SNF consult. PT is recommending home health. RN case manager aware of above. Please reconsult if future social work needs arise. CSW signing off.   Didi Ganaway, LCSW [phone] 

## 2018-01-19 NOTE — Discharge Summary (Signed)
Physician Discharge Summary  Patient ID: Monica [name] MRN: [id number] DOB/AGE: 78-20-1942 78 y.o.  Admit date: 01/18/2018 Discharge date: 01/20/2018  Admission Diagnoses:  primary osteoarthritis of left hip   Discharge Diagnoses: Patient Active Problem List   Diagnosis Date Noted  . 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 Degenerative joint disease (DJD) of hip 10/09/2014  . History of total right hip replacement 10/09/2014  . Anxiety 07/14/2013    Past Medical History:  Diagnosis Date  . Anxiety   . Arthritis   . Complication of anesthesia    woke up agitated following hernia surgery 02/2014  . COPD (chronic obstructive pulmonary disease) (HCC)      Transfusion: No transfusions during this admission   Consultants (if any):   Discharged Condition: Improved  Hospital Course: Azalee Weimer is an 77 y.o. female who was admitted 01/18/2018 with a diagnosis of degenerative arthrosis left hip and went to the operating room on 01/18/2018 and underwent the above named procedures.    Surgeries:Procedure(s): TOTAL HIP ARTHROPLASTY on 01/18/2018  PRE-OPERATIVE DIAGNOSIS: Degenerative arthrosis of the left hip, primary  POST-OPERATIVE DIAGNOSIS:  Same  PROCEDURE:  Left total hip arthroplasty  SURGEON:  Jena Gauss. M.D.  ASSISTANT:  Van Clines, PA (present and scrubbed throughout the case, critical for assistance with exposure, retraction, instrumentation, and closure)  ANESTHESIA: spinal  ESTIMATED BLOOD LOSS: 50 mL  FLUIDS REPLACED: 900 mL of crystalloid  DRAINS: 2 medium drains to a Hemovac reservoir  IMPLANTS UTILIZED: DePuy 13.5 mm small stature AML femoral stem, 56 mm OD Pinnacle 100 acetabular component, neutral Pinnacle Marathon polyethylene insert, and a 36 mm M-SPEC was 1.5 mm hip ball  INDICATIONS FOR SURGERY: Monica [name] is a 77 y.o. year old female with a long history of progressive hip and groin  pain. X-rays demonstrated severe  degenerative changes. The patient had not seen any significant improvement despite conservative nonsurgical intervention. After discussion of the risks and benefits of surgical intervention, the patient expressed understanding of the risks benefits and agree with plans for total hip arthroplasty.   The risks, benefits, and alternatives were discussed at length including but not limited to the risks of infection, bleeding, nerve injury, stiffness, blood clots, the need for revision surgery, limb length inequality, dislocation, cardiopulmonary complications, among others, and they were willing to proceed. Patient tolerated the surgery well. No complications .Patient was taken to PACU where she was stabilized and then transferred to the orthopedic floor.  Patient started on Lovenox 30 mg q 12 hrs. Foot pumps applied bilaterally at 80 mm hgb. Heels elevated off bed with rolled towels. No evidence of DVT. Calves non tender. Negative Homan. Physical therapy started on day #1 for gait training and transfer with OT starting on  day #1 for ADL and assisted devices. Patient has done well with therapy. Ambulated greater than 200 feet upon being discharged.  Was able to ascend and descend 4 steps safely and independently  Patient's IV And Foley were discontinued on day #1 with Hemovac being discontinued on day #2. Dressing was changed on day 2 prior to patient being discharged   She was given perioperative antibiotics:  Anti-infectives (From admission, onward)   Start     Dose/Rate Route Frequency Ordered Stop   01/18/18 1300  clindamycin (CLEOCIN) IVPB 600 mg     600 mg 100 mL/hr over 30 Minutes Intravenous Every 6 hours 01/18/18 1155 01/19/18 0633   01/18/18 0605  ceFAZolin (ANCEF) 2-4 GM/100ML-% IVPB  Note to Pharmacy:  Mike Craze   : cabinet override      01/18/18 0605 01/18/18 0743   01/18/18 0600  ceFAZolin (ANCEF) IVPB 2g/100 mL premix     2 g 200 mL/hr over 30 Minutes Intravenous On call to  O.R. 01/17/18 2225 01/18/18 0753    .  She was fitted with AV 1 compression foot pump devices, instructed on heel pumps, early ambulation, and fitted with TED stockings bilaterally for DVT prophylaxis.  She benefited maximally from the hospital stay and there were no complications.    Recent vital signs:  Vitals:   01/18/18 1942 01/18/18 2316  BP: (!) 91/52 (!) 94/47  Pulse: 83 82  Resp: 17 16  Temp: 97.7 F (36.5 C) (!) 97.5 F (36.4 C)  SpO2: 96% 94%    Recent laboratory studies:  Lab Results  Component Value Date   HGB 13.6 01/05/2018   HGB 10.0 (L) 10/11/2014   HGB 9.7 (L) 10/10/2014   Lab Results  Component Value Date   WBC 7.8 01/05/2018   PLT 223 01/05/2018   Lab Results  Component Value Date   INR 0.97 01/05/2018   Lab Results  Component Value Date   NA 143 01/05/2018   K 3.8 01/05/2018   CL 105 01/05/2018   CO2 29 01/05/2018   BUN 12 01/05/2018   CREATININE 0.43 (L) 01/05/2018   GLUCOSE 97 01/05/2018    Discharge Medications:   Allergies as of 01/19/2018      Reactions   Amoxicillin Diarrhea   Has patient had a PCN reaction causing immediate rash, facial/tongue/throat swelling, SOB or lightheadedness with hypotension: No Has patient had a PCN reaction causing severe rash involving mucus membranes or skin necrosis: No Has patient had a PCN reaction that required hospitalization: No Has patient had a PCN reaction occurring within the last 10 years: No If all of the above answers are "NO", then may proceed with Cephalosporin use.      Medication List    TAKE these medications   acetaminophen 500 MG tablet Commonly known as:  TYLENOL Take 500-1,000 mg by mouth See admin instructions. Take 1,000mg  by mouth in the morning, 500mg  in the afternoon and 1,000mg  at bedtime   CALCIUM 600/VITAMIN D3 600-800 MG-UNIT Tabs Generic drug:  Calcium Carb-Cholecalciferol Take 1 tablet by mouth every other day.   clindamycin 300 MG capsule Commonly known as:   CLEOCIN Take 600 mg by mouth once. 1 hour prior to dental work   clonazePAM 0.5 MG tablet Commonly known as:  KLONOPIN Take 0.25 mg by mouth daily.   [employer] ANTACID EXTRA 750 MG chewable tablet Generic drug:  calcium carbonate Chew 1 tablet by mouth daily as needed for heartburn.   enoxaparin 40 MG/0.4ML injection Commonly known as:  LOVENOX Inject 0.4 mLs (40 mg total) into the skin daily for 14 days. Start taking on:  01/21/2018   ENSURE Take 237 mLs by mouth daily with lunch.   loratadine 10 MG tablet Commonly known as:  CLARITIN Take 10 mg by mouth daily as needed for allergies.   multivitamin with minerals Tabs tablet Take 1 tablet by mouth daily.   oxyCODONE 5 MG immediate release tablet Commonly known as:  Oxy IR/ROXICODONE Take 1 tablet (5 mg total) by mouth every 4 (four) hours as needed for moderate pain (pain score 4-6).   REFRESH TEARS 0.5 % Soln Generic drug:  carboxymethylcellulose Place 1 drop into both eyes daily as needed (dry eyes).  traMADol 50 MG tablet Commonly known as:  ULTRAM Take 1-2 tablets (50-100 mg total) by mouth every 4 (four) hours as needed for moderate pain. Start taking on:  01/21/2018   trolamine salicylate 10 % cream Commonly known as:  ASPERCREME Apply 1 application topically daily.            Durable Medical Equipment  (From admission, onward)         Start     Ordered   01/18/18 1156  DME Walker rolling  Once    Question:  Patient needs a walker to treat with the following condition  Answer:  S/P total hip arthroplasty   01/18/18 1155   01/18/18 1156  DME Bedside commode  Once    Question:  Patient needs a bedside commode to treat with the following condition  Answer:  S/P total hip arthroplasty   01/18/18 1155          Diagnostic Studies: Dg Hip Port Unilat With Pelvis 1v Left  Result Date: 01/18/2018 CLINICAL DATA:  Postop left hip replacement EXAM: DG HIP (WITH OR WITHOUT PELVIS) 1V PORT LEFT COMPARISON:   None. FINDINGS: Postop films show the acetabular and femoral component of the left hip replacement to be in good position. No complicating features are seen. Prior right hip replacement is noted. The bones appear somewhat osteopenic. IMPRESSION: New left total hip replacement components in good position. No complicating features. Electronically Signed   By: Dwyane Dee M.D.   On: 01/18/2018 12:23    Disposition:   Discharge Instructions    Diet - low sodium heart healthy   Complete by:  As directed    Increase activity slowly   Complete by:  As directed       Follow-up Information    Hooten, Illene Labrador, MD On 03/07/2018.   Specialty:  Orthopedic Surgery Why:  at 1:45pm Contact information: [address] [facility] [place] [address] [phone]            Signed: Tera Partridge. 01/19/2018, 7:50 AM

## 2018-01-19 NOTE — Progress Notes (Signed)
Physical Therapy Treatment Patient Details Name: Monica [name] MRN: [id number] DOB: 03-04-1941 Today's Date: 01/19/2018    History of Present Illness Pt is a 77 y/o F s/p L THA.  Pt's PMH includes COPD.      PT Comments    Ms. Murch made excellent progress toward her mobility goals this session, thus updated follow up recommendations to HHPT, SW made aware.  Pt ambulated 180 ft with RW and min guard for safety.  She required min assist for bed mobility and cues for proper positioning of LLE with sit<>stand.  Pt recalled 3/3 hip precautions at the end of the session.  BP monitored closely throughout session.  BP taken in supine at start of session: 97/53, down to 87/55 in sitting EOB, improved to 102/56 ~2 minutes later sitting EOB, 130/83 in standing, 95/65 sitting in chair at end of session.  Pt denies dizziness/lightheadedness throughout entire session.  RN made aware of BP readings. Pt will benefit from continued skilled PT services to increase functional independence and safety.    Follow Up Recommendations  Home health PT;Supervision for mobility/OOB     Equipment Recommendations  None recommended by PT    Recommendations for Other Services OT consult     Precautions / Restrictions Precautions Precautions: Fall;Posterior Hip Precaution Comments: Pt recalled 2/3 hip precautions at start of session and 3/3 hip precautions at end of session Restrictions Weight Bearing Restrictions: Yes LLE Weight Bearing: Weight bearing as tolerated    Mobility  Bed Mobility Overal bed mobility: Needs Assistance Bed Mobility: Supine to Sit     Supine to sit: Min assist;HOB elevated     General bed mobility comments: Min assist to advance LLE to EOB.  Pt requires increased tmie and effort.   Transfers Overall transfer level: Needs assistance Equipment used: Rolling walker (2 wheeled) Transfers: Sit to/from Stand Sit to Stand: Min guard         General transfer comment: Cues for  proper hand placement and safe technique.  Pt is slow to rise which she says is intentional to "play it safe".  Cues for positioning of LLE and to reach back for armrests of chair when preparing to sit.   Ambulation/Gait Ambulation/Gait assistance: Min guard Gait Distance (Feet): 180 Feet Assistive device: Rolling walker (2 wheeled) Gait Pattern/deviations: Step-through pattern;Decreased step length - right;Decreased step length - left;Decreased weight shift to left     General Gait Details: Pt demonstrates naturally to step through gait pattern without cues.  Min guard and chair follow for safety, pt denies dizziness/lightheadedness while ambulating.    Stairs             Wheelchair Mobility    Modified Rankin (Stroke Patients Only)       Balance Overall balance assessment: Needs assistance Sitting-balance support: No upper extremity supported;Feet supported Sitting balance-Leahy Scale: Good     Standing balance support: Single extremity supported;During functional activity Standing balance-Leahy Scale: Poor Standing balance comment: Relies on at least 1UE support for static and dynamic activities                            Cognition Arousal/Alertness: Awake/alert Behavior During Therapy: WFL for tasks assessed/performed Overall Cognitive Status: Within Functional Limits for tasks assessed  Exercises Total Joint Exercises Quad Sets: Strengthening;Both;10 reps;Supine Short Arc Quad: Strengthening;Left;10 reps;Supine Hip ABduction/ADduction: AROM;Left;10 reps;Supine Long Arc Quad: Strengthening;Left;10 reps;Seated Other Exercises Other Exercises: Pt educated in compression sock mgt including donning/doffing, wear schedule, and positioning. Other Exercises: Pt educated in using AE for LB dressing. Other Exercises: Pt demonstrated understanding of hip precautions during sit to stand transfer. Other  Exercises: Pt educated on home environment modifications for increased safety.    General Comments General comments (skin integrity, edema, etc.): BP taken in supine at start of session: 97/53, down to 87/55 in sitting EOB, improved to 102/56 ~2 minutes later sitting EOB, 130/83 in standing, 95/65 sitting in chair at end of session.  Pt denies dizziness/lightheadedness throughout entire session.  RN made aware of BP readings.       Pertinent Vitals/Pain Pain Assessment: Faces Faces Pain Scale: Hurts little more Pain Location: L hip, buttocks, thigh Pain Descriptors / Indicators: Aching;Discomfort Pain Intervention(s): Limited activity within patient's tolerance;Monitored during session;Repositioned;Ice applied    Home Living Family/patient expects to be discharged to:: Private residence Living Arrangements: Alone Available Help at Discharge: Family;Available 24 hours/day Type of Home: House Home Access: Stairs to enter   Home Layout: Two level;Able to live on main level with bedroom/bathroom Home Equipment: Grab bars - tub/shower;Bedside commode;Walker - 2 wheels;Cane - single point      Prior Function Level of Independence: Independent with assistive device(s)      Comments: Ambulating with cane in community, RW in home.  Pt still driving.  Takes sponge bath. Ind with dressing.    PT Goals (current goals can now be found in the care plan section) Acute Rehab PT Goals Patient Stated Goal: "I'm ready to go home" PT Goal Formulation: With patient Time For Goal Achievement: 02/01/18 Potential to Achieve Goals: Good Progress towards PT goals: Progressing toward goals    Frequency    BID      PT Plan Discharge plan needs to be updated    Co-evaluation              AM-PAC PT "6 Clicks" Daily Activity  Outcome Measure  Difficulty turning over in bed (including adjusting bedclothes, sheets and blankets)?: Unable Difficulty moving from lying on back to sitting on the  side of the bed? : Unable Difficulty sitting down on and standing up from a chair with arms (e.g., wheelchair, bedside commode, etc,.)?: A Lot Help needed moving to and from a bed to chair (including a wheelchair)?: A Little Help needed walking in hospital room?: A Little Help needed climbing 3-5 steps with a railing? : A Little 6 Click Score: 13    End of Session Equipment Utilized During Treatment: Gait belt Activity Tolerance: Patient tolerated treatment well Patient left: with family/visitor present;in chair;with call bell/phone within reach;with chair alarm set Nurse Communication: Mobility status;Other (comment)(BP readings) PT Visit Diagnosis: Pain;Muscle weakness (generalized) (M62.81);Difficulty in walking, not elsewhere classified (R26.2) Pain - Right/Left: Left Pain - part of body: Hip     Time: 1610-9604 PT Time Calculation (min) (ACUTE ONLY): 40 min  Charges:  $Gait Training: 8-22 mins $Therapeutic Exercise: 8-22 mins $Therapeutic Activity: 8-22 mins                     Encarnacion Chu PT, DPT 01/19/2018, 11:26 AM

## 2018-01-19 NOTE — Progress Notes (Signed)
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  GOAL:   Patient will meet greater than or equal to 90% of their needs  MONITOR:   PO intake, Supplement acceptance, Labs, Weight trends, Skin, I & O's  REASON FOR ASSESSMENT:   Malnutrition Screening Tool    ASSESSMENT:   77 y/o female admitted for left total hip arthroplasty   Met with pt in room today. Pt reports good appetite and oral intake pta. Pt is a self reported "picky eater". Pt does not like many foods and avoids other foods r/t fear of messing up her dental work. Pt does not like green jello, fruit cocktail or gravy. Pt currently on a GI soft diet; RD suspects pt was supposed to be ordered for a mechanical soft diet instead. RD offered mechanical soft diet to pt but pt reports "if my meats come chopped up, I will get nauseas". Pt prefers to be on a regular diet as she feels she can choose foods appropriate for her to eat. Pt ate pancakes for breakfast this morning and part of a fruit cup. Per chart, pt with 9lb(7%) wt loss from February to July but pt has gained back ~3-4lbs since July. Pt reports weight loss was r/t a combination of recent dental work and hip pain. Pt reports that she was told by MD to start drinking Ensure at that time and she regained back some of the weight. Pt has continued to drink chocolate Ensure at home and would prefer to get these in hospital as well. Pt already ordered for Oscal and MVI but refused them this morning.   Medications reviewed and include: Oscal with D, lovenox, ferrous sulfate, reglan, MVI, protonix,  senokot  Labs reviewed:   NUTRITION - FOCUSED PHYSICAL EXAM:    Most Recent Value  Orbital Region  Moderate depletion  Upper Arm Region  Moderate depletion  Thoracic and Lumbar Region  Moderate depletion  Buccal Region  Moderate depletion  Temple Region  Moderate depletion  Clavicle Bone Region  Severe depletion  Clavicle and Acromion Bone Region  Severe depletion  Scapular Bone Region  Moderate depletion  Dorsal Hand  Severe depletion  Patellar Region  Moderate depletion  Anterior Thigh Region  Moderate depletion  Posterior Calf Region  Moderate depletion  Edema (RD Assessment)  None  Hair  Reviewed  Eyes  Reviewed  Mouth  Reviewed  Skin  Reviewed  Nails  Reviewed     Diet Order:   Diet Order            Diet regular Room service appropriate? Yes; Fluid consistency: Thin  Diet effective now        Diet - low sodium heart healthy             EDUCATION NEEDS:   Education needs have been addressed  Skin:  Skin Assessment: Reviewed RN Assessment(incision L hip, closed incision R hip)  Last BM:  10/16  Height:   Ht Readings from Last 1 Encounters:  01/18/18 5' 4.5" (1.638 m)    Weight:   Wt Readings from Last 1 Encounters:  01/18/18 52.8 kg  Ideal Body Weight:  55.6 kg  BMI:  Body mass index is 19.68 kg/m.  Estimated Nutritional Needs:   Kcal:  1300-1500kcal/day   Protein:  70-80g/day   Fluid:  >1.3L/day   Koleen Distance MS, RD, LDN Pager #- [phone] Office#- [phone] After Hours Pager: [phone]

## 2018-01-19 NOTE — Progress Notes (Signed)
Physical Therapy Treatment Patient Details Name: Lakera Viall MRN: [id number] DOB: 1941/01/30 Today's Date: 01/19/2018    History of Present Illness Pt is a 77 y/o F s/p L THA.  Pt's PMH includes COPD.      PT Comments    Ms. Lentz made good progress with mobility and did not require physical assist for bed mobility.  Pt recalled 3/3 hip precautions and demonstrated understanding of functional implications of hip precautions during session.  Continued to monitor pt's BP closely.  BP taken in supine: 93/62, sitting EOB 100/57, standing at bedside 74/64, standing taken again 96/62, supine at end of session 110/50.  Pt denies symptoms throughout entire session.    Follow Up Recommendations  Home health PT;Supervision for mobility/OOB     Equipment Recommendations  None recommended by PT    Recommendations for Other Services       Precautions / Restrictions Precautions Precautions: Fall;Posterior Hip Precaution Comments: Pt recalled 3/3 hip precautions Restrictions Weight Bearing Restrictions: Yes LLE Weight Bearing: Weight bearing as tolerated    Mobility  Bed Mobility Overal bed mobility: Needs Assistance Bed Mobility: Supine to Sit;Sit to Supine     Supine to sit: HOB elevated;Min guard Sit to supine: Min guard   General bed mobility comments: Pt adheres to hip precautions without cues needed for supine>sit.  Does not use bed rail.  Requires slightly increased time.   Transfers Overall transfer level: Needs assistance Equipment used: Rolling walker (2 wheeled) Transfers: Sit to/from Stand Sit to Stand: Min guard         General transfer comment: Pt demonstrated safe technique and adherence to hip precautions with sit>stand without cues needed.  Min guard provided for safety due to lower BP readings.   Ambulation/Gait Ambulation/Gait assistance: Min guard Gait Distance (Feet): 180 Feet Assistive device: Rolling walker (2 wheeled) Gait Pattern/deviations:  Step-through pattern;Decreased step length - right;Decreased step length - left;Decreased weight shift to left     General Gait Details: No cues needed.  Pt remains steady using RW.  Chair follow for safety.    Stairs             Wheelchair Mobility    Modified Rankin (Stroke Patients Only)       Balance Overall balance assessment: Needs assistance Sitting-balance support: No upper extremity supported;Feet supported Sitting balance-Leahy Scale: Good     Standing balance support: Single extremity supported;During functional activity Standing balance-Leahy Scale: Poor Standing balance comment: Relies on at least 1UE support for static and dynamic activities                            Cognition Arousal/Alertness: Awake/alert Behavior During Therapy: WFL for tasks assessed/performed Overall Cognitive Status: Within Functional Limits for tasks assessed                                        Exercises Total Joint Exercises Ankle Circles/Pumps: AROM;Both;10 reps;Supine Quad Sets: Strengthening;Both;10 reps;Supine Hip ABduction/ADduction: AROM;Left;Supine;5 reps Long Arc Quad: Strengthening;Left;10 reps;Seated    General Comments General comments (skin integrity, edema, etc.): BP taken in supine: 93/62, sitting EOB 100/57, standing at bedside 74/64, standing taken again 96/62, supine at end of session 110/50.  Pt denies symptoms throughout entire session.       Pertinent Vitals/Pain Pain Assessment: Faces Faces Pain Scale: Hurts little more Pain Location: L hip  Pain Descriptors / Indicators: Aching;Discomfort Pain Intervention(s): Limited activity within patient's tolerance;Monitored during session    Home Living                      Prior Function            PT Goals (current goals can now be found in the care plan section) Acute Rehab PT Goals Patient Stated Goal: "I'm ready to go home" PT Goal Formulation: With  patient Time For Goal Achievement: 02/01/18 Potential to Achieve Goals: Good Progress towards PT goals: Progressing toward goals    Frequency    BID      PT Plan Current plan remains appropriate    Co-evaluation              AM-PAC PT "6 Clicks" Daily Activity  Outcome Measure  Difficulty turning over in bed (including adjusting bedclothes, sheets and blankets)?: A Little Difficulty moving from lying on back to sitting on the side of the bed? : A Little Difficulty sitting down on and standing up from a chair with arms (e.g., wheelchair, bedside commode, etc,.)?: A Little Help needed moving to and from a bed to chair (including a wheelchair)?: A Little Help needed walking in hospital room?: A Little Help needed climbing 3-5 steps with a railing? : A Little 6 Click Score: 18    End of Session Equipment Utilized During Treatment: Gait belt Activity Tolerance: Patient tolerated treatment well Patient left: with family/visitor present;with call bell/phone within reach;in bed;with bed alarm set Nurse Communication: Mobility status PT Visit Diagnosis: Pain;Muscle weakness (generalized) (M62.81);Difficulty in walking, not elsewhere classified (R26.2) Pain - Right/Left: Left Pain - part of body: Hip     Time: 2841-3244 PT Time Calculation (min) (ACUTE ONLY): 35 min  Charges:  $Gait Training: 8-22 mins $Therapeutic Exercise: 8-22 mins                     Encarnacion Chu PT, DPT 01/19/2018, 3:36 PM

## 2018-01-19 NOTE — Progress Notes (Signed)
   Subjective: 1 Day Post-Op Procedure(s) (LRB): TOTAL HIP ARTHROPLASTY (Left) Patient reports pain as mild.   Patient is well, and has had no acute complaints or problems Patient was able to do bed exercises with physical therapy yesterday Plan is to go Home after hospital stay. no nausea and no vomiting Patient denies any chest pains or shortness of breath. Objective: Vital signs in last 24 hours: Temp:  [97.4 F (36.3 C)-97.8 F (36.6 C)] 97.5 F (36.4 C) (10/16 2316) Pulse Rate:  [72-90] 82 (10/16 2316) Resp:  [10-20] 16 (10/16 2316) BP: (80-165)/(47-140) 94/47 (10/16 2316) SpO2:  [94 %-100 %] 94 % (10/16 2316) well approximated incision Heels are non tender and elevated off the bed using rolled towels Intake/Output from previous day: 10/16 0701 - 10/17 0700 In: 2503.1 [P.O.:540; I.V.:1472.4; IV Piggyback:490.6] Out: 2545 [Urine:2275; Drains:220; Blood:50] Intake/Output this shift: No intake/output data recorded.  No results for input(s): HGB in the last 72 hours. No results for input(s): WBC, RBC, HCT, PLT in the last 72 hours. No results for input(s): NA, K, CL, CO2, BUN, CREATININE, GLUCOSE, CALCIUM in the last 72 hours. No results for input(s): LABPT, INR in the last 72 hours.  EXAM General - Patient is Alert, Appropriate and Oriented Extremity - Neurologically intact Neurovascular intact Sensation intact distally Intact pulses distally Dorsiflexion/Plantar flexion intact No cellulitis present Compartment soft Dressing - dressing C/D/I Motor Function - intact, moving foot and toes well on exam.    Past Medical History:  Diagnosis Date  . Anxiety   . Arthritis   . Complication of anesthesia    woke up agitated following hernia surgery 02/2014  . COPD (chronic obstructive pulmonary disease) (HCC)     Assessment/Plan: 1 Day Post-Op Procedure(s) (LRB): TOTAL HIP ARTHROPLASTY (Left) Active Problems:   Status post total replacement of hip  Estimated body  mass index is 19.68 kg/m as calculated from the following:   Height as of this encounter: 5' 4.5" (1.638 m).   Weight as of this encounter: 52.8 kg. Advance diet Up with therapy D/C IV fluids Plan for discharge tomorrow Discharge home with home health  Labs: None DVT Prophylaxis - Lovenox, Foot Pumps and TED hose Weight-Bearing as tolerated to left leg D/C O2 and Pulse OX and try on Room Air Begin working on bowel movement  [facility] PA [facility] 01/19/2018, 7:22 AM

## 2018-01-19 NOTE — Anesthesia Postprocedure Evaluation (Signed)
Anesthesia Post Note  Patient: Monica [name]  Procedure(s) Performed: TOTAL HIP ARTHROPLASTY (Left )  Patient location during evaluation: Nursing Unit Anesthesia Type: Spinal Level of consciousness: oriented and awake and alert Pain management: pain level controlled Vital Signs Assessment: post-procedure vital signs reviewed and stable Respiratory status: spontaneous breathing and respiratory function stable Cardiovascular status: blood pressure returned to baseline and stable Postop Assessment: no headache, no backache and no apparent nausea or vomiting Anesthetic complications: no     Last Vitals:  Vitals:   01/18/18 1942 01/18/18 2316  BP: (!) 91/52 (!) 94/47  Pulse: 83 82  Resp: 17 16  Temp: 36.5 C (!) 36.4 C  SpO2: 96% 94%    Last Pain:  Vitals:   01/19/18 0553  TempSrc:   PainSc: 0-No pain                 Mathews Argyle P

## 2018-01-20 DIAGNOSIS — E440 Moderate protein-calorie malnutrition: Secondary | ICD-10-CM

## 2018-01-20 LAB — SURGICAL PATHOLOGY

## 2018-01-20 NOTE — Progress Notes (Signed)
Physical Therapy Treatment Patient Details Name: Monica [name] MRN: [id number] DOB: 1940-12-29 Today's Date: 01/20/2018    History of Present Illness Pt is a 77 y/o F s/p L THA.  Pt's PMH includes COPD.      PT Comments    [name] completed stair training this session with min guard for safety.  Pt ambulated 220 ft with RW without signs of instability.  Pt demonstrated understanding of posterior precautions. HHPT remains appropriate d/c plan.     Follow Up Recommendations  Home health PT;Supervision for mobility/OOB     Equipment Recommendations  None recommended by PT    Recommendations for Other Services       Precautions / Restrictions Precautions Precautions: Fall;Posterior Hip Precaution Comments: Pt recalled 3/3 hip precautions Restrictions Weight Bearing Restrictions: Yes LLE Weight Bearing: Weight bearing as tolerated    Mobility  Bed Mobility Overal bed mobility: Needs Assistance Bed Mobility: Supine to Sit     Supine to sit: HOB elevated;Supervision     General bed mobility comments: Pt adheres to hip precautions without cues needed for supine>sit.  Does not use bed rail.  Requires slightly increased time.   Transfers Overall transfer level: Needs assistance Equipment used: Rolling walker (2 wheeled) Transfers: Sit to/from Stand Sit to Stand: Supervision         General transfer comment: Pt demonstrated safe technique and adherence to hip precautions with sit>stand without cues needed.  Supervision for safety  Ambulation/Gait Ambulation/Gait assistance: Supervision Gait Distance (Feet): 220 Feet Assistive device: Rolling walker (2 wheeled) Gait Pattern/deviations: Step-through pattern;Decreased step length - right;Decreased step length - left;Decreased weight shift to left     General Gait Details: Pt requires cues to keep both hands on RW at all times when ambulating as pt at times lifts L hand to point to region of pain while continuing to  ambulate.    Stairs Stairs: Yes Stairs assistance: Min guard Stair Management: One rail Right;No rails;Step to pattern;Forwards;With walker Number of Stairs: 5(4,1) General stair comments: Pt demonstrated proper technique with cues to ascend/descend 4 steps with R rail and then navigating 1 step without rails   Wheelchair Mobility    Modified Rankin (Stroke Patients Only)       Balance Overall balance assessment: Needs assistance Sitting-balance support: No upper extremity supported;Feet supported Sitting balance-Leahy Scale: Good     Standing balance support: Single extremity supported;During functional activity Standing balance-Leahy Scale: Poor Standing balance comment: Relies on at least 1UE support                             Cognition Arousal/Alertness: Awake/alert Behavior During Therapy: WFL for tasks assessed/performed Overall Cognitive Status: Within Functional Limits for tasks assessed                                        Exercises      General Comments General comments (skin integrity, edema, etc.): BP supine at start of session: 95/55, sitting EOB 104/60, standing at bedside 115/71, sitting in chair at end of session 110/59.  Educated pt on safety in home with use of proper footwear      Pertinent Vitals/Pain Pain Assessment: Faces Faces Pain Scale: Hurts even more Pain Location: L hip Pain Descriptors / Indicators: Aching;Discomfort Pain Intervention(s): Limited activity within patient's tolerance;Monitored during session;Repositioned    Home Living  Prior Function            PT Goals (current goals can now be found in the care plan section) Acute Rehab PT Goals Patient Stated Goal: to go home today PT Goal Formulation: With patient Time For Goal Achievement: 02/01/18 Potential to Achieve Goals: Good Progress towards PT goals: Progressing toward goals    Frequency    BID       PT Plan Current plan remains appropriate    Co-evaluation              AM-PAC PT "6 Clicks" Daily Activity  Outcome Measure  Difficulty turning over in bed (including adjusting bedclothes, sheets and blankets)?: A Little Difficulty moving from lying on back to sitting on the side of the bed? : A Little Difficulty sitting down on and standing up from a chair with arms (e.g., wheelchair, bedside commode, etc,.)?: A Little Help needed moving to and from a bed to chair (including a wheelchair)?: A Little Help needed walking in hospital room?: A Little Help needed climbing 3-5 steps with a railing? : A Little 6 Click Score: 18    End of Session Equipment Utilized During Treatment: Gait belt Activity Tolerance: Patient tolerated treatment well Patient left: with call bell/phone within reach;in chair;with chair alarm set Nurse Communication: Mobility status PT Visit Diagnosis: Pain;Muscle weakness (generalized) (M62.81);Difficulty in walking, not elsewhere classified (R26.2) Pain - Right/Left: Left Pain - part of body: Hip     Time: 1610-9604 PT Time Calculation (min) (ACUTE ONLY): 33 min  Charges:  $Gait Training: 23-37 mins                     Encarnacion Chu PT, DPT 01/20/2018, 1:29 PM

## 2018-01-20 NOTE — Progress Notes (Signed)
Extra honeycomb dressings given to patient per order. Rx's given to patient for Oxycodone and Tramadol. Discharge instructions reviewed with patient, patient verbalized understanding.

## 2018-01-20 NOTE — Progress Notes (Signed)
   Subjective: 2 Days Post-Op Procedure(s) (LRB): TOTAL HIP ARTHROPLASTY (Left) Patient reports pain as mild.   Patient is well, and has had no acute complaints or problems Patient did well with physical therapy yesterday.  Was able to ambulate 180 feet.  Still needs to do stairs prior to being discharged.  Plan is to go Home after hospital stay. no nausea and no vomiting Patient denies any chest pains or shortness of breath. Objective: Vital signs in last 24 hours: Temp:  [97.7 F (36.5 C)-97.9 F (36.6 C)] 97.7 F (36.5 C) (10/17 2304) Pulse Rate:  [76-90] 87 (10/17 2304) Resp:  [19] 19 (10/17 2304) BP: (94-111)/(54-62) 111/62 (10/17 2304) SpO2:  [92 %-100 %] 92 % (10/17 2304) well approximated incision Heels are non tender and elevated off the bed using rolled towels Intake/Output from previous day: 10/17 0701 - 10/18 0700 In: -  Out: 120 [Drains:120] Intake/Output this shift: No intake/output data recorded.  No results for input(s): HGB in the last 72 hours. No results for input(s): WBC, RBC, HCT, PLT in the last 72 hours. No results for input(s): NA, K, CL, CO2, BUN, CREATININE, GLUCOSE, CALCIUM in the last 72 hours. No results for input(s): LABPT, INR in the last 72 hours.  EXAM General - Patient is Alert, Appropriate and Oriented Extremity - Neurologically intact Neurovascular intact Sensation intact distally Intact pulses distally Dorsiflexion/Plantar flexion intact No cellulitis present Compartment soft Dressing - moderate drainage Motor Function - intact, moving foot and toes well on exam.    Past Medical History:  Diagnosis Date  . Anxiety   . Arthritis   . Complication of anesthesia    woke up agitated following hernia surgery 02/2014  . COPD (chronic obstructive pulmonary disease) (HCC)     Assessment/Plan: 2 Days Post-Op Procedure(s) (LRB): TOTAL HIP ARTHROPLASTY (Left) Active Problems:   Status post total replacement of hip   Malnutrition of  moderate degree  Estimated body mass index is 19.68 kg/m as calculated from the following:   Height as of this encounter: 5' 4.5" (1.638 m).   Weight as of this encounter: 52.8 kg. Up with therapy  Labs: None DVT Prophylaxis - Lovenox, Foot Pumps and TED hose Weight-Bearing as tolerated to left leg Patient needs a bowel movement prior to being discharged Please give the patient 2 extra honeycomb dressings to take home Patient still needs to do steps prior to being discharged Hemovac was discontinued today.  Into the drain appeared to be intact.  Lynnda Shields. [facility] PA [facility] 01/20/2018, 7:58 AM

## 2018-01-20 NOTE — Care Management Note (Signed)
Case Management Note  Patient Details  Name: Monica [name] MRN: [id number] Date of Birth: Sep 17, 1940  Subjective/Objective:  POD # 2 left hip arthroplasty. Met with patient at bedside to discuss discharge planning. She lives alone. She has multiple family members that will be coming together to care for patient after discharge. She has a walker and bsc. Offered a list of home health agencies. Referral to [facility] for home health PT.Pharmacy: [name]. Cost of Lovenox is $ 30.00. Patient updated.                     Action/Plan: [facility] for HHPT. Lovenox ready for pick up  Expected Discharge Date:  01/20/18               Expected Discharge Plan:  [place]  In-House Referral:     Discharge planning Services  CM Consult  Post Acute Care Choice:  Home Health Choice offered to:  Patient  DME Arranged:    DME Agency:     HH Arranged:  PT [place]:  [facility] (formerly [employer])  Status of Service:  Completed, signed off  If discussed at [employer], dates discussed:    Additional Comments:  [name], RN 01/20/2018, 11:42 AM

## 2018-06-15 ENCOUNTER — Other Ambulatory Visit: Payer: Self-pay | Admitting: Internal Medicine

## 2018-06-15 DIAGNOSIS — Z1231 Encounter for screening mammogram for malignant neoplasm of breast: Secondary | ICD-10-CM

## 2018-12-21 ENCOUNTER — Ambulatory Visit
Admission: RE | Admit: 2018-12-21 | Discharge: 2018-12-21 | Disposition: A | Discharge status: Home / Self Care | Payer: Medicare Other | Source: Ambulatory Visit | Attending: Internal Medicine | Admitting: Internal Medicine

## 2018-12-21 DIAGNOSIS — Z1231 Encounter for screening mammogram for malignant neoplasm of breast: Secondary | ICD-10-CM | POA: Insufficient documentation

## 2018-12-26 ENCOUNTER — Other Ambulatory Visit: Payer: Self-pay | Admitting: Internal Medicine

## 2018-12-26 DIAGNOSIS — N6320 Unspecified lump in the left breast, unspecified quadrant: Secondary | ICD-10-CM

## 2018-12-26 DIAGNOSIS — N6310 Unspecified lump in the right breast, unspecified quadrant: Secondary | ICD-10-CM

## 2018-12-26 DIAGNOSIS — R928 Other abnormal and inconclusive findings on diagnostic imaging of breast: Secondary | ICD-10-CM

## 2019-01-03 ENCOUNTER — Ambulatory Visit
Admission: RE | Admit: 2019-01-03 | Discharge: 2019-01-03 | Disposition: A | Discharge status: Home / Self Care | Payer: Medicare Other | Source: Ambulatory Visit | Attending: Internal Medicine | Admitting: Internal Medicine

## 2019-01-03 DIAGNOSIS — R928 Other abnormal and inconclusive findings on diagnostic imaging of breast: Secondary | ICD-10-CM | POA: Diagnosis not present

## 2019-01-03 DIAGNOSIS — N6310 Unspecified lump in the right breast, unspecified quadrant: Secondary | ICD-10-CM

## 2019-01-03 DIAGNOSIS — N6320 Unspecified lump in the left breast, unspecified quadrant: Secondary | ICD-10-CM

## 2019-01-08 ENCOUNTER — Other Ambulatory Visit: Payer: Self-pay | Admitting: Internal Medicine

## 2019-01-08 DIAGNOSIS — N6489 Other specified disorders of breast: Secondary | ICD-10-CM

## 2019-06-08 ENCOUNTER — Other Ambulatory Visit: Payer: Self-pay

## 2019-06-08 ENCOUNTER — Ambulatory Visit
Admission: RE | Admit: 2019-06-08 | Discharge: 2019-06-08 | Disposition: A | Discharge status: Home / Self Care | Payer: Medicare Other | Source: Ambulatory Visit | Attending: Internal Medicine | Admitting: Internal Medicine

## 2019-06-08 DIAGNOSIS — N6489 Other specified disorders of breast: Secondary | ICD-10-CM | POA: Diagnosis present

## 2019-06-11 ENCOUNTER — Other Ambulatory Visit: Payer: Self-pay | Admitting: Internal Medicine

## 2019-06-11 DIAGNOSIS — N6489 Other specified disorders of breast: Secondary | ICD-10-CM

## 2019-06-22 IMAGING — DX DG HIP (WITH OR WITHOUT PELVIS) 1V PORT*L*
2 series · 2 of 2 positions shown · non-contrast
Comparison: None.

CLINICAL DATA: Postop left hip replacement

EXAM:
DG HIP (WITH OR WITHOUT PELVIS) 1V PORT LEFT

[pelvis ap]
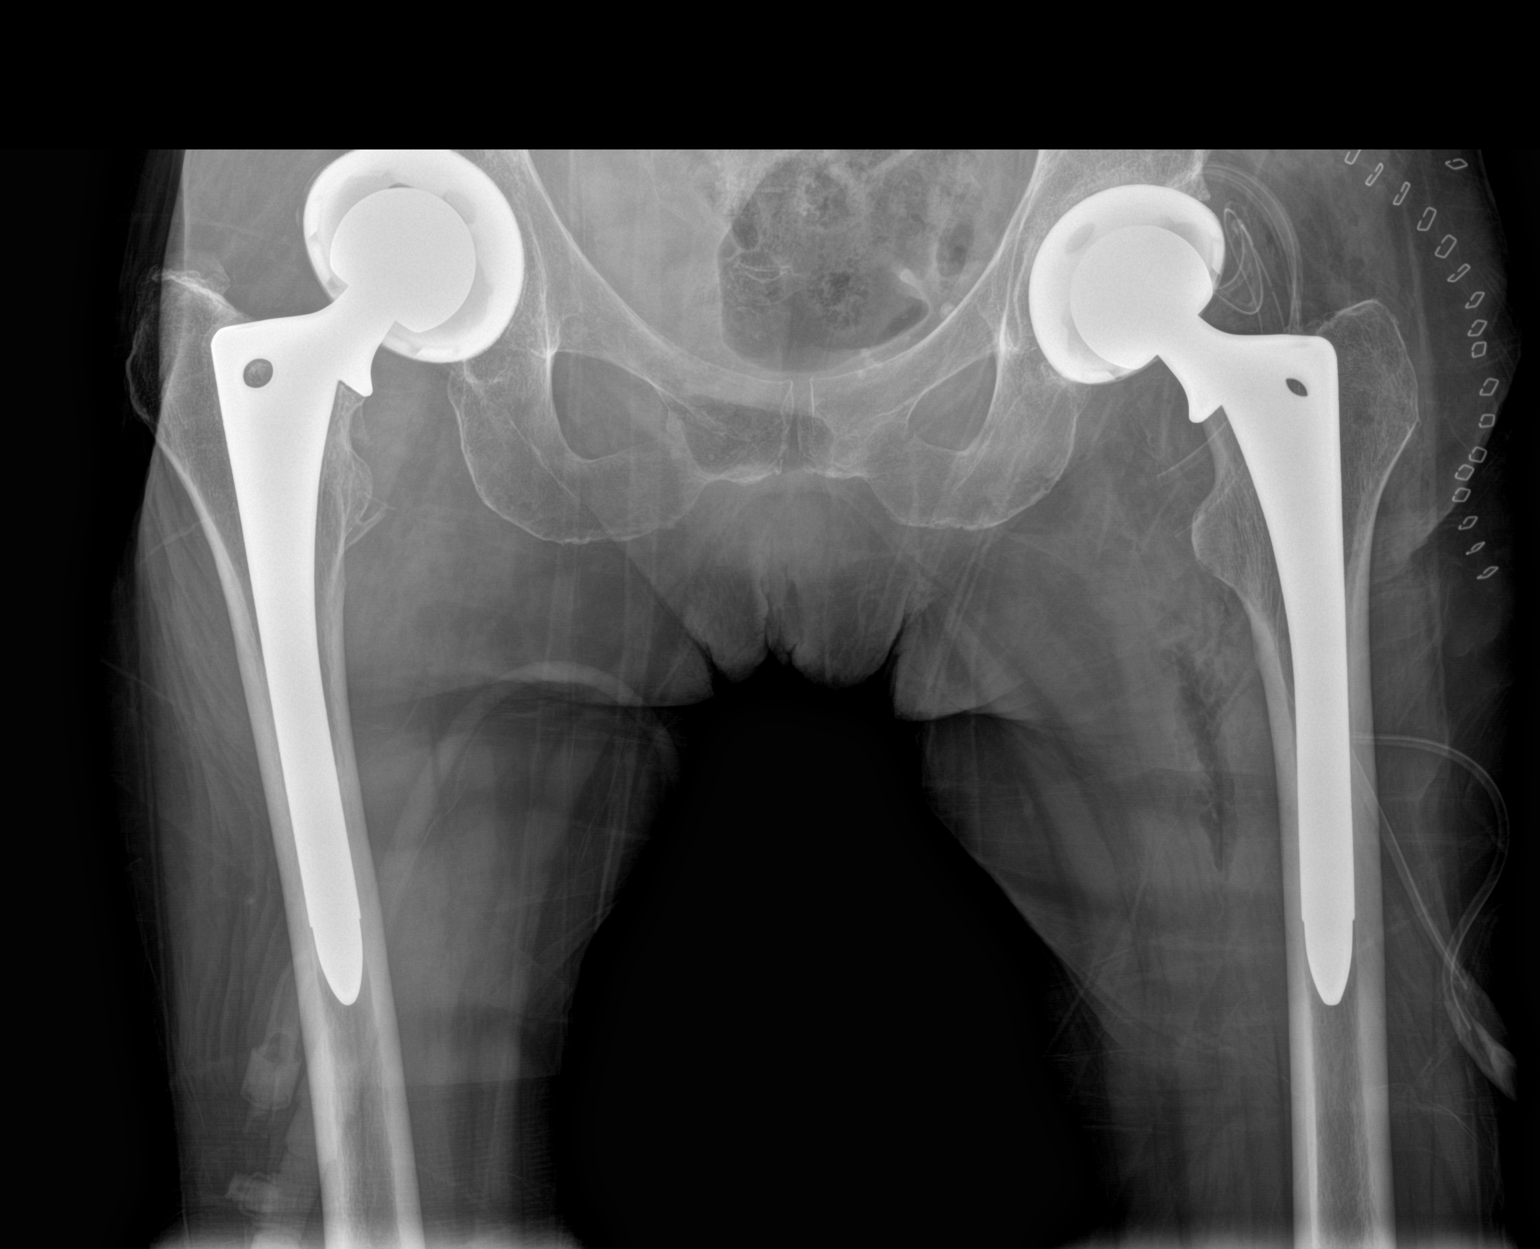

[hip lat]
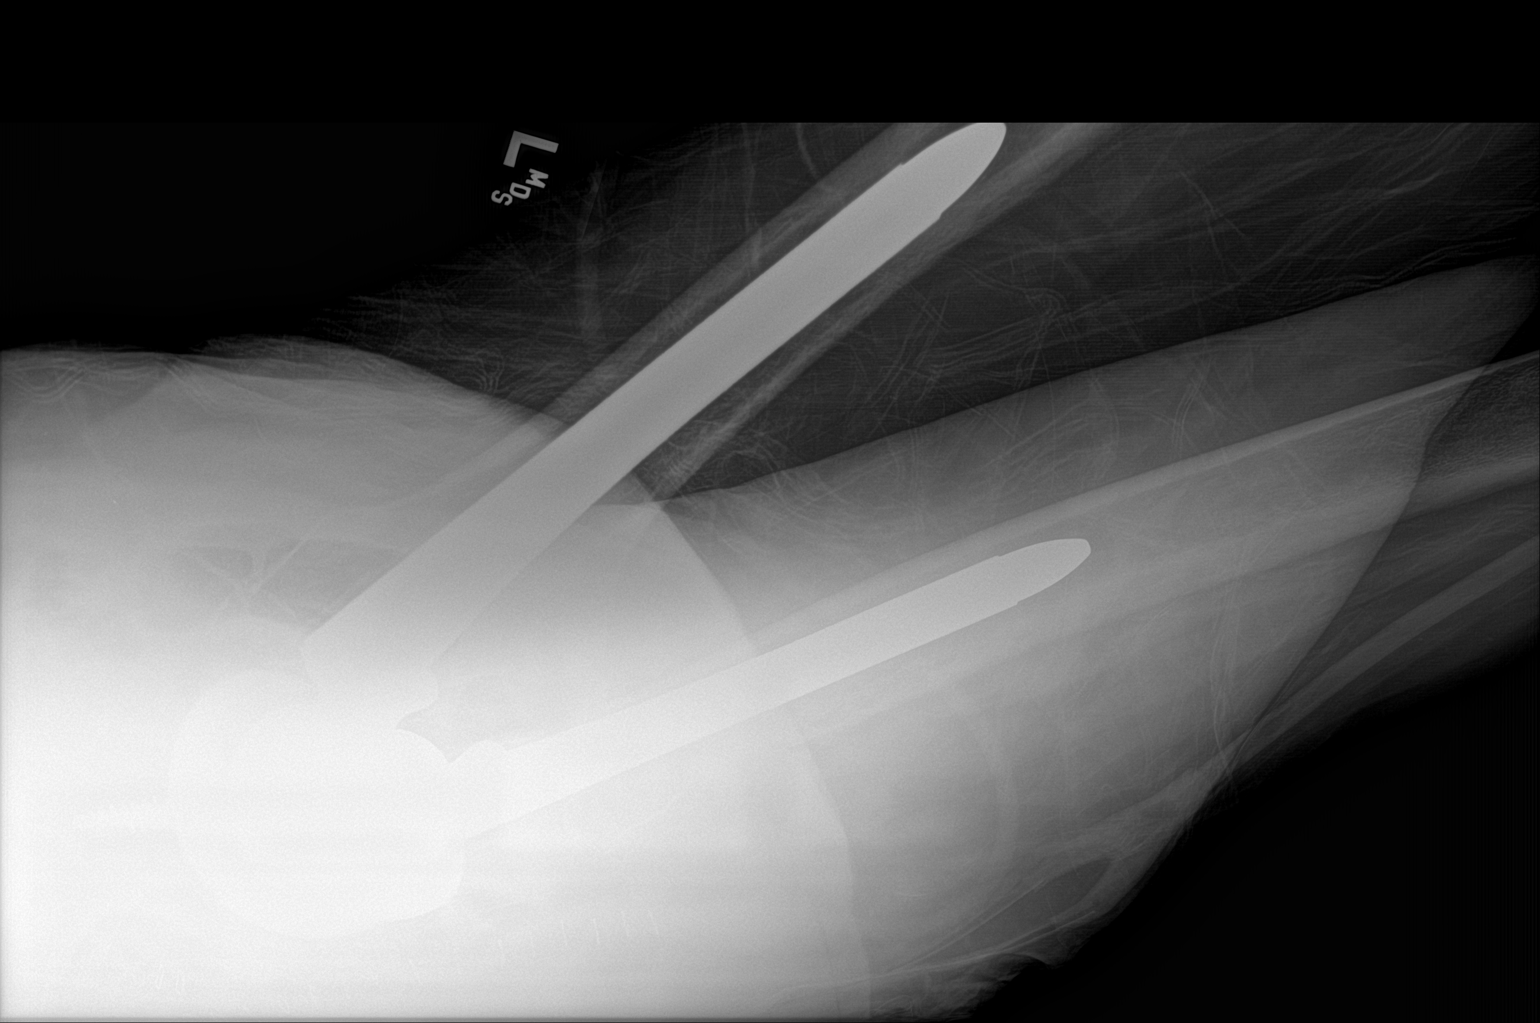

[2 of 2 positions shown; findings below may reference images not displayed]

FINDINGS: Postop films show the acetabular and femoral component of the left
hip replacement to be in good position. No complicating features are
seen. Prior right hip replacement is noted. The bones appear
somewhat osteopenic.
IMPRESSION: New left total hip replacement components in good position. No
complicating features.

## 2020-01-11 ENCOUNTER — Ambulatory Visit
Admission: RE | Admit: 2020-01-11 | Discharge: 2020-01-11 | Disposition: A | Discharge status: Home / Self Care | Payer: Medicare Other | Source: Ambulatory Visit | Attending: Internal Medicine | Admitting: Internal Medicine

## 2020-01-11 ENCOUNTER — Other Ambulatory Visit: Payer: Self-pay

## 2020-01-11 DIAGNOSIS — N6489 Other specified disorders of breast: Secondary | ICD-10-CM

## 2021-01-22 ENCOUNTER — Emergency Department: Payer: Medicare Other

## 2021-01-22 ENCOUNTER — Emergency Department
Admission: EM | Admit: 2021-01-22 | Discharge: 2021-01-22 | Disposition: A | Discharge status: Home / Self Care | Payer: Medicare Other | Attending: Emergency Medicine | Admitting: Emergency Medicine

## 2021-01-22 ENCOUNTER — Other Ambulatory Visit: Payer: Self-pay

## 2021-01-22 ENCOUNTER — Encounter: Payer: Self-pay | Admitting: Emergency Medicine

## 2021-01-22 DIAGNOSIS — M79604 Pain in right leg: Secondary | ICD-10-CM | POA: Insufficient documentation

## 2021-01-22 DIAGNOSIS — S8001XA Contusion of right knee, initial encounter: Secondary | ICD-10-CM | POA: Diagnosis not present

## 2021-01-22 DIAGNOSIS — Z96643 Presence of artificial hip joint, bilateral: Secondary | ICD-10-CM | POA: Insufficient documentation

## 2021-01-22 DIAGNOSIS — W228XXA Striking against or struck by other objects, initial encounter: Secondary | ICD-10-CM | POA: Insufficient documentation

## 2021-01-22 DIAGNOSIS — Z87891 Personal history of nicotine dependence: Secondary | ICD-10-CM | POA: Diagnosis not present

## 2021-01-22 DIAGNOSIS — R58 Hemorrhage, not elsewhere classified: Secondary | ICD-10-CM

## 2021-01-22 DIAGNOSIS — J449 Chronic obstructive pulmonary disease, unspecified: Secondary | ICD-10-CM | POA: Insufficient documentation

## 2021-01-22 DIAGNOSIS — S8991XA Unspecified injury of right lower leg, initial encounter: Secondary | ICD-10-CM | POA: Diagnosis present

## 2021-01-22 NOTE — ED Triage Notes (Signed)
Pt reports that she woke up on Sunday, her right leg would not go straight so she took her leg and hit it up and down on her mattress. She noticed a bruise on her leg the other day and then she went to the dentist and they told her to get to the doctor to see what they bruising and pain was. Pt is concerned she has a blood clot. There is no swelling or warmth to it. She is having pain and bruising to her right knee.

## 2021-01-22 NOTE — ED Provider Notes (Signed)
[facility] Emergency Department Provider Note   ____________________________________________   Event Date/Time   First MD Initiated Contact with Patient 01/22/21 1833     (approximate)  I have reviewed the triage vital signs and the nursing notes.   HISTORY  Chief Complaint Leg Injury    HPI Monica [name] is a 80 y.o. female with past medical history of arthritis, anxiety, and COPD who presents to the ED complaining of leg injury.  Patient reports that 4 days ago she woke up with a cramp in her right calf.  She states that she was unable to fully extend the leg due to the cramp and she struck it multiple times on her mattress in order to alleviate this.  2 days later, she started to notice some bruising and swelling over the right lateral portion of her knee and calf.  She denies any associated discomfort, but states she was told by the walk-in clinic that she needed to be assessed for a blood clot.  She has been walking on the leg without difficulty, denies any difficulty bending her knee.  She denies any history of blood clots and does not take any blood thinners.        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 Authorizing Provider  acetaminophen (TYLENOL) 500 MG tablet Take 500-1,000 mg by mouth See admin instructions. Take 1,000mg  by mouth in the morning, 500mg  in the afternoon and 1,000mg  at bedtime    [provider]  Calcium Carb-Cholecalciferol (CALCIUM 600/VITAMIN D3) 600-800 MG-UNIT TABS Take 1 tablet by mouth every other day.     [provider]  calcium carbonate ([employer] ANTACID EXTRA) 750 MG chewable tablet Chew 1 tablet by mouth daily as needed for heartburn.    [provider]  carboxymethylcellulose (REFRESH TEARS) 0.5 % SOLN Place 1 drop into both eyes daily as needed (dry eyes).    [provider]  clindamycin (CLEOCIN) 300 MG capsule Take 600 mg by mouth once. 1 hour prior to dental work    [provider]  clonazePAM (KLONOPIN) 0.5 MG tablet Take 0.25 mg by mouth daily. 11/30/17   [provider]  enoxaparin (LOVENOX) 40 MG/0.4ML injection Inject 0.4 mLs (40 mg total) into the skin daily for 14 days. 01/21/18 02/04/18  13/2/19, PA  ENSURE (ENSURE) Take 237 mLs by mouth daily with lunch.    [provider]  loratadine (CLARITIN) 10 MG tablet Take  10 mg by mouth daily as needed for allergies.    [provider]  Multiple Vitamin (MULTIVITAMIN WITH MINERALS) TABS tablet Take 1 tablet by mouth daily.    [provider]  oxyCODONE (OXY IR/ROXICODONE) 5 MG immediate release tablet Take 1 tablet (5 mg total) by mouth every 4 (four) hours as needed for moderate pain (pain score 4-6). 01/19/18   Tera Partridge, PA  traMADol (ULTRAM) 50 MG tablet Take 1-2 tablets (50-100 mg total) by mouth every 4 (four) hours as needed for moderate pain. 01/21/18   Tera Partridge, PA  trolamine salicylate (ASPERCREME) 10 % cream Apply  1 application topically daily.     [provider]    Allergies Amoxicillin  Family History  Problem Relation Age of Onset   Breast cancer Paternal Aunt 31    Social History Social History   Tobacco Use   Smoking status: Former    Types: Cigarettes    Quit date: 12/14/2006    Years since quitting: 14.1   Smokeless tobacco: Never  Vaping Use   Vaping Use: Never used  Substance Use Topics   Alcohol use: No   Drug use: No    Review of Systems  Constitutional: No fever/chills Eyes: No visual changes. ENT: No sore throat. Cardiovascular: Denies chest pain. Respiratory: Denies shortness of breath. Gastrointestinal: No abdominal pain.  No nausea, no vomiting.  No diarrhea.  No constipation. Genitourinary: Negative for dysuria. Musculoskeletal: Negative for back pain.  Positive for right knee and lower leg bruising and swelling. Skin: Negative for rash. Neurological: Negative for headaches, focal weakness or numbness.  ____________________________________________   PHYSICAL EXAM:  VITAL SIGNS: ED Triage Vitals  Enc Vitals Group     BP 01/22/21 1524 121/77     Pulse Rate 01/22/21 1524 88     Resp 01/22/21 1524 18     Temp 01/22/21 1524 98.7 F (37.1 C)     Temp Source 01/22/21 1524 Oral     SpO2 01/22/21 1524 97 %     Weight 01/22/21 1532 116 lb (52.6 kg)     Height 01/22/21 1532 5\' 5"  (1.651 m)     Head Circumference --      Peak Flow --      Pain Score 01/22/21 1532 0     Pain Loc --      Pain Edu? --      Excl. in GC? --     Constitutional: Alert and oriented. Eyes: Conjunctivae are normal. Head: Atraumatic. Nose: No congestion/rhinnorhea. Mouth/Throat: Mucous membranes are moist. Neck: Normal ROM Cardiovascular: Normal rate, regular rhythm. Grossly normal heart sounds.  2+ DP pulses bilaterally. Respiratory: Normal respiratory effort.  No retractions. Lungs CTAB. Gastrointestinal: Soft and nontender. No distention. Genitourinary:  deferred Musculoskeletal: Mild ecchymosis to right lateral knee and upper calf with mild associated edema but no tenderness to palpation.  No bony tenderness to palpation and range of motion of right knee is intact without pain. Neurologic:  Normal speech and language. No gross focal neurologic deficits are appreciated. Skin:  Skin is warm, dry and intact. No rash noted. Psychiatric: Mood and affect are normal. Speech and behavior are normal.  ____________________________________________   LABS (all labs ordered are listed, but only abnormal results are displayed)  Labs Reviewed - No data to display   PROCEDURES  Procedure(s) performed (including Critical Care):  Procedures   ____________________________________________   INITIAL IMPRESSION / ASSESSMENT AND PLAN / ED COURSE  80 year old female with past medical history of arthritis, anxiety, and COPD who presents to the ED complaining of bruising and mild swelling to her right lateral knee and calf after striking her leg multiple times on her mattress.  She is neurovascular intact to her right lower extremity, ultrasound shows no evidence of DVT.  I doubt acute bony injury as patient denies any pain, range of motion is intact, and she ambulates on the leg without difficulty whatsoever.  Symptoms consistent with mild leg contusion and patient is appropriate for discharge home with PCP follow-up as needed.  She was counseled to return to the ED for new worsening symptoms, patient and family agree with plan.      ____________________________________________   FINAL CLINICAL IMPRESSION(S) / ED DIAGNOSES  Final diagnoses:  Contusion of right knee and lower leg, initial encounter  Ecchymosis     ED Discharge Orders     None        Note:  This document was prepared using Dragon voice recognition software and may include unintentional dictation errors.    Chesley Noon, MD 01/22/21 [phone]

## 2021-06-14 IMAGING — MG DIGITAL DIAGNOSTIC BILAT W/ TOMO W/ CAD
6 of 10 series · 6 of 30 positions shown · non-contrast
Comparison: Previous exam(s).

CLINICAL DATA: 78-year-old female for 1 year follow-up of RIGHT
breast asymmetry without sonographic correlate and for annual
bilateral mammogram.

EXAM:
DIGITAL DIAGNOSTIC BILATERAL MAMMOGRAM WITH CAD AND TOMO

[R MLO synth-2D]
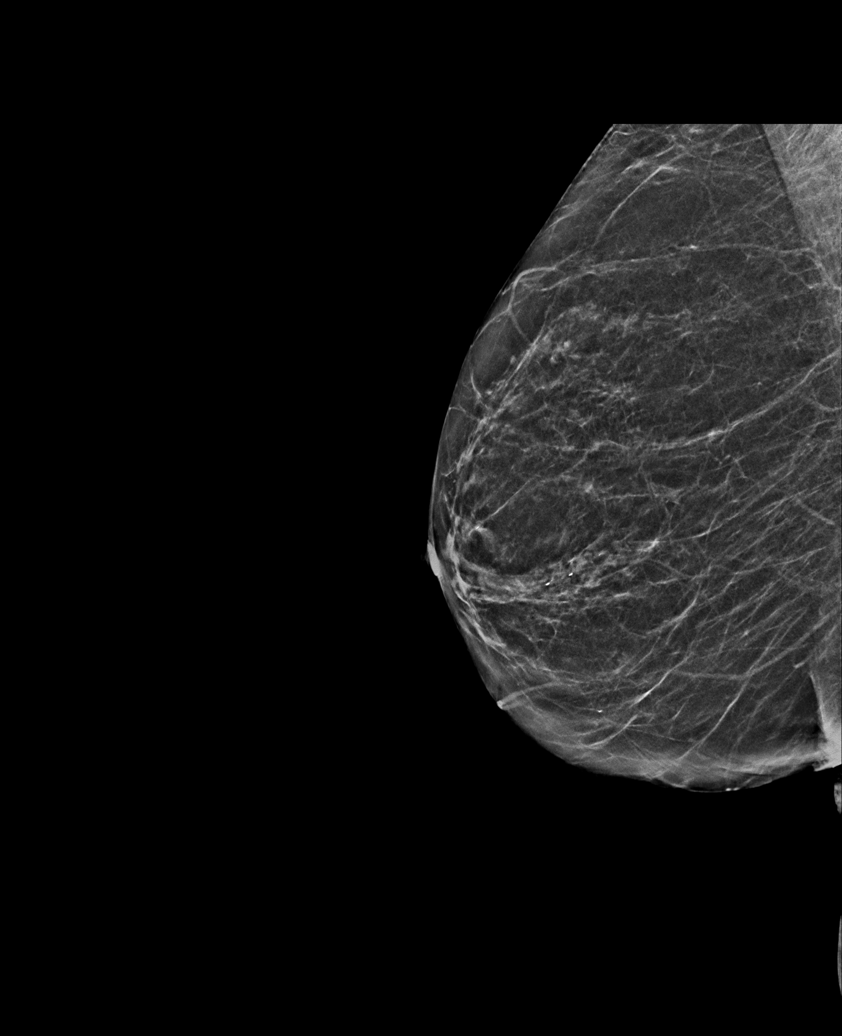

[L CC synth-2D (1 of 2)]
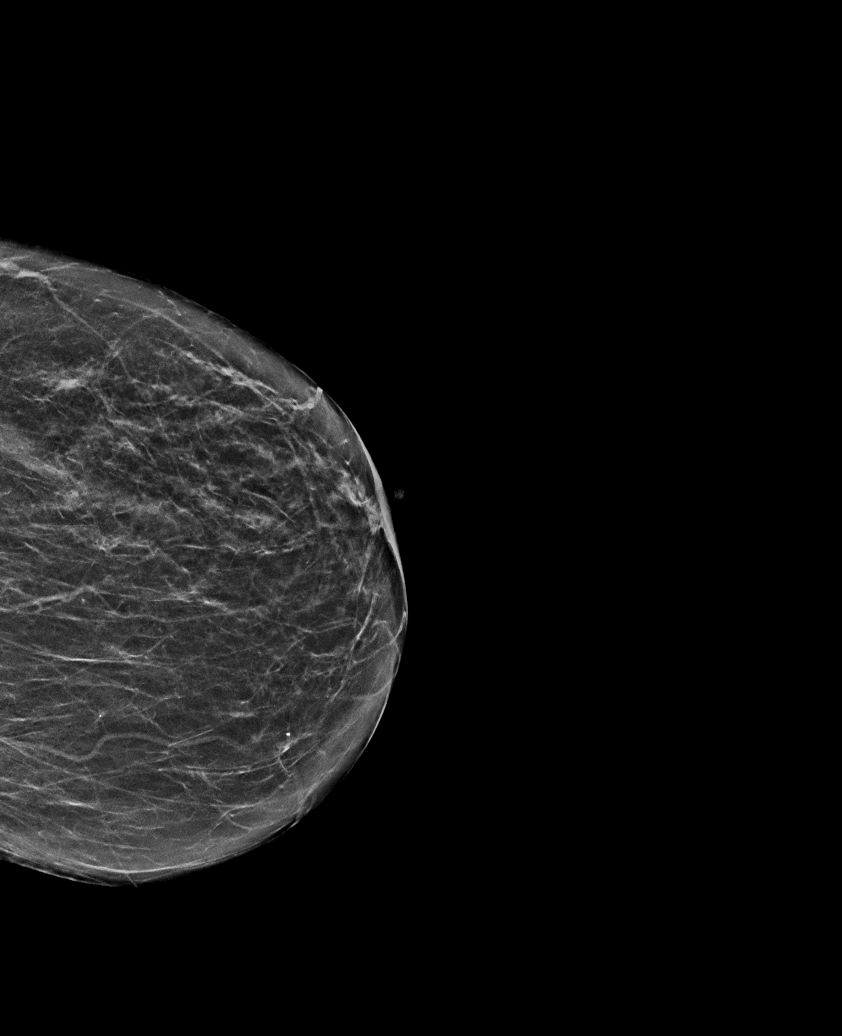

[L CC synth-2D (2 of 2)]
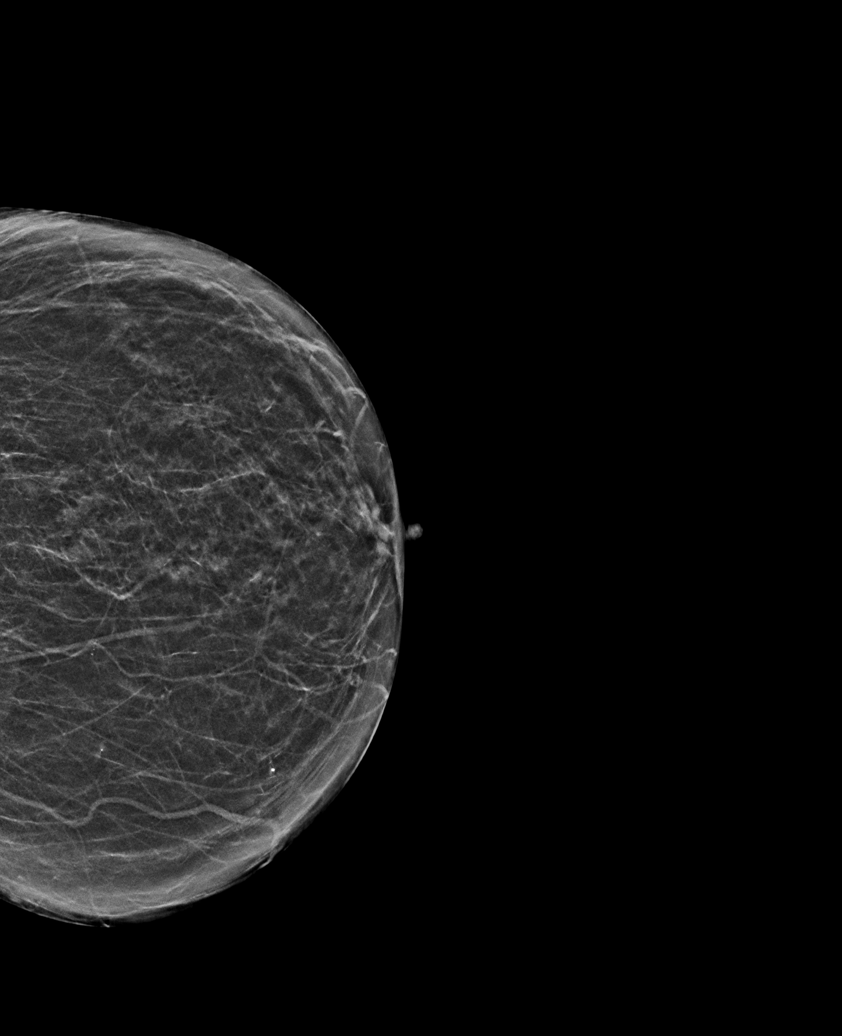

[R CC synth-2D]
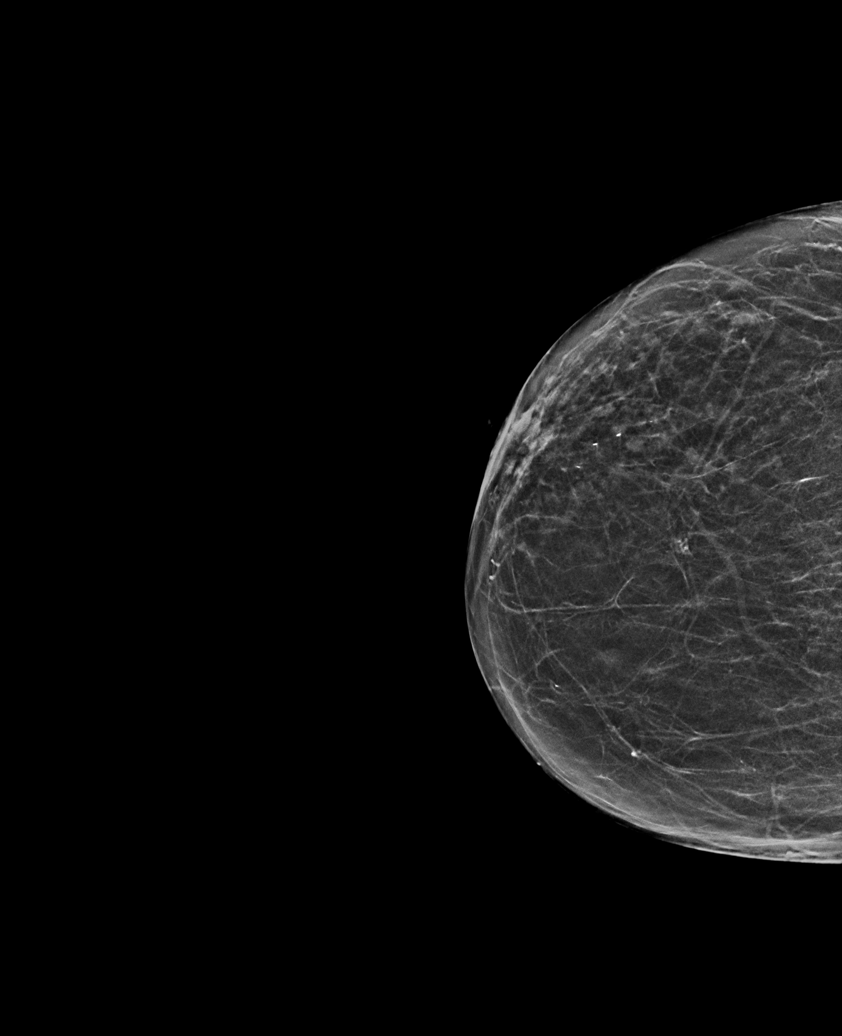

[L MLO synth-2D]
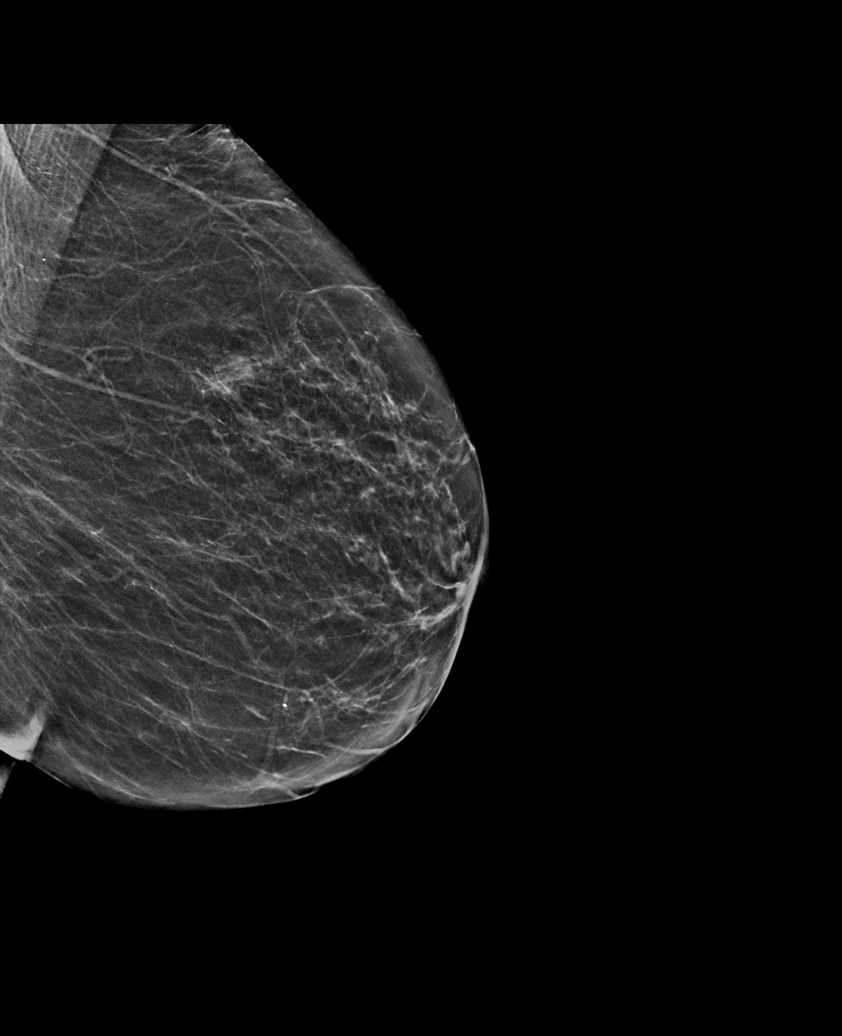

[L CC tomo · tomo slice 23/45.0]
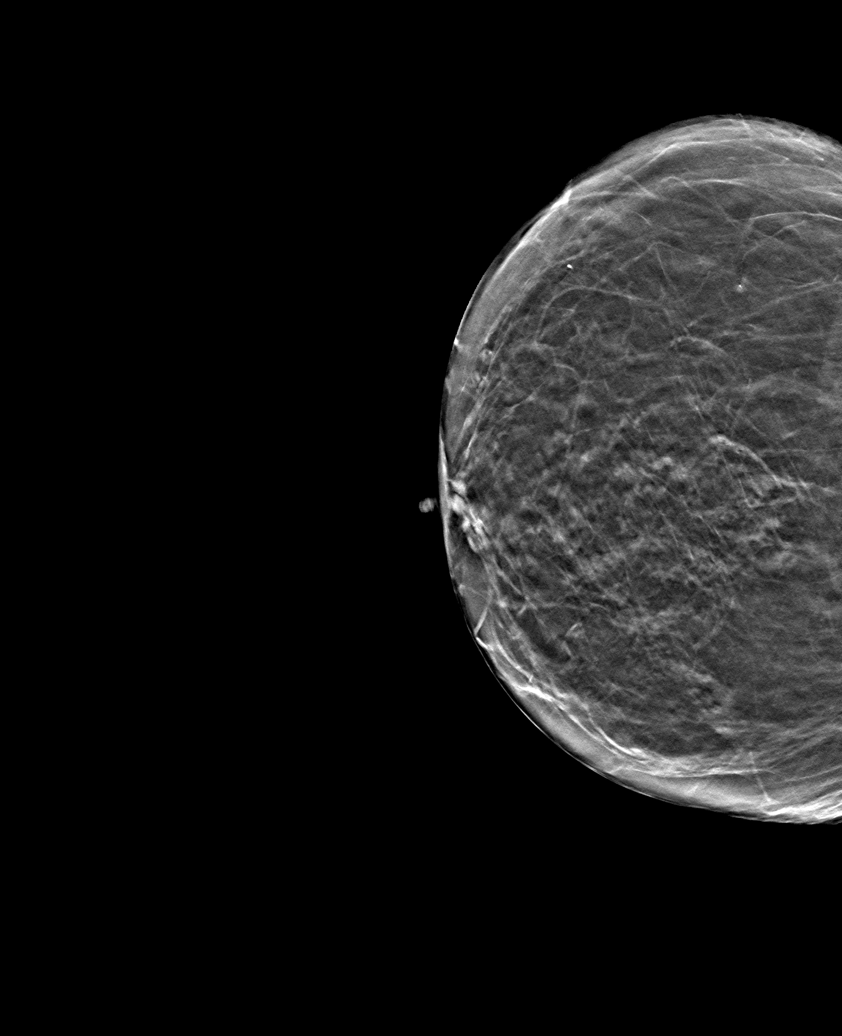

[6 of 30 positions shown; findings below may reference images not displayed]

ACR Breast Density Category b: There are scattered areas of
fibroglandular density.
FINDINGS: 2D/3D full field views of both breasts demonstrate less conspicuous
UPPER INNER RIGHT breast asymmetry.

No suspicious mammographic findings are noted within either breast.

Mammographic images were processed with CAD.
IMPRESSION: 1. Less conspicuous RIGHT breast asymmetry, compatible with a benign
process.
2. No mammographic evidence of breast malignancy.

RECOMMENDATION:
Bilateral screening mammogram in 1 year as clinically indicated.

I have discussed the findings and recommendations with the patient.
If applicable, a reminder letter will be sent to the patient
regarding the next appointment.

BI-RADS CATEGORY  2: Benign.

## 2022-06-26 IMAGING — US US EXTREM LOW VENOUS*R*
1 series · 13 of 24 positions shown · non-contrast
Comparison: None.

CLINICAL DATA: Pain and bruising in the right lower extremity.



[Series 1: us venous img lower uni right (dvt) · portal-venous · 13 of 35 slices shown]
[im 1/35]
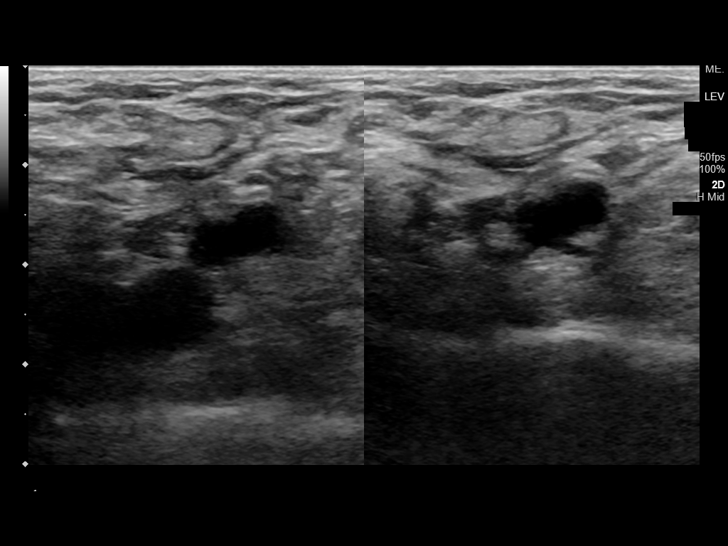
[im 3/35]
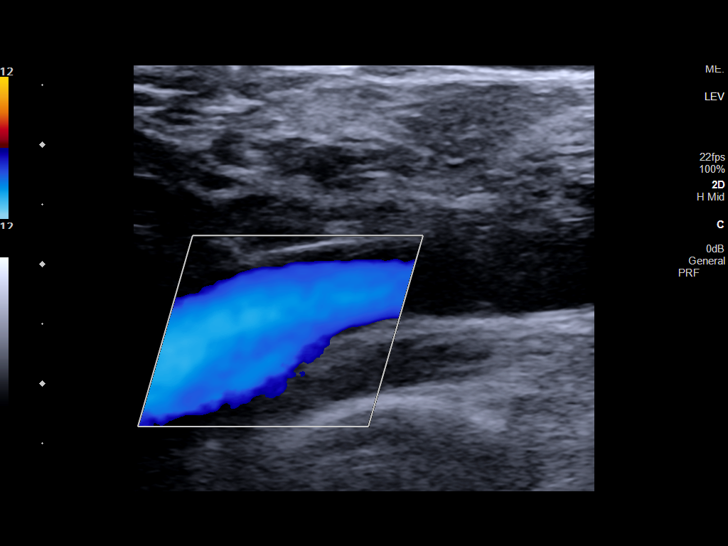
[im 6/35]
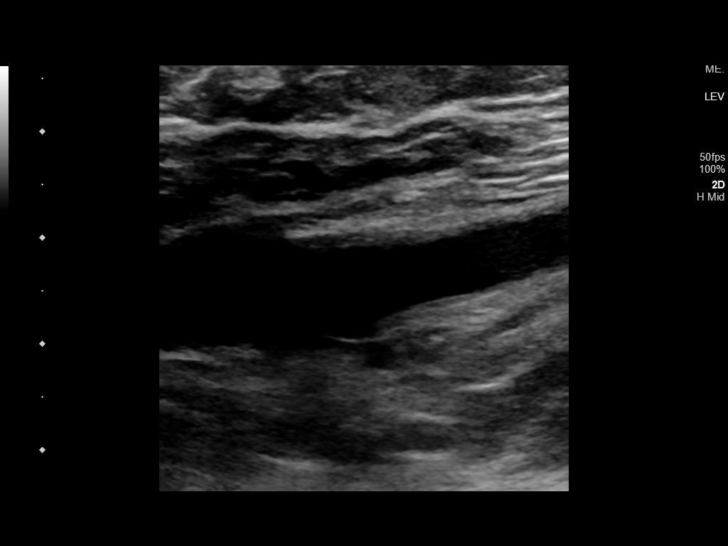
[im 9/35]
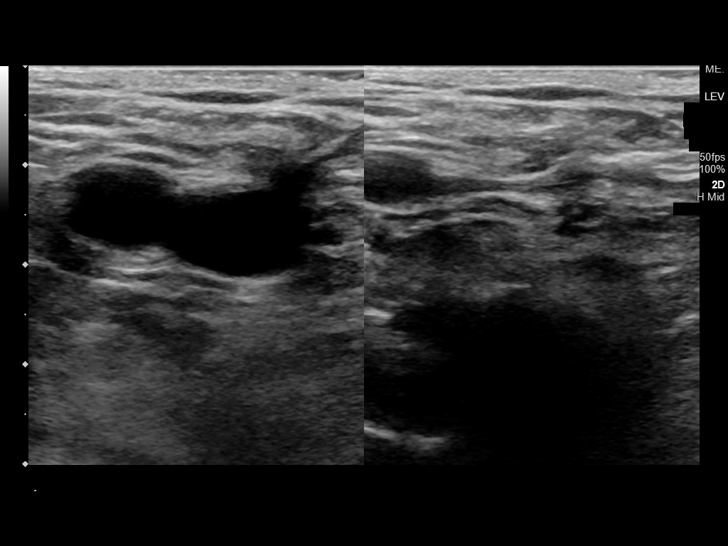
[im 12/35]
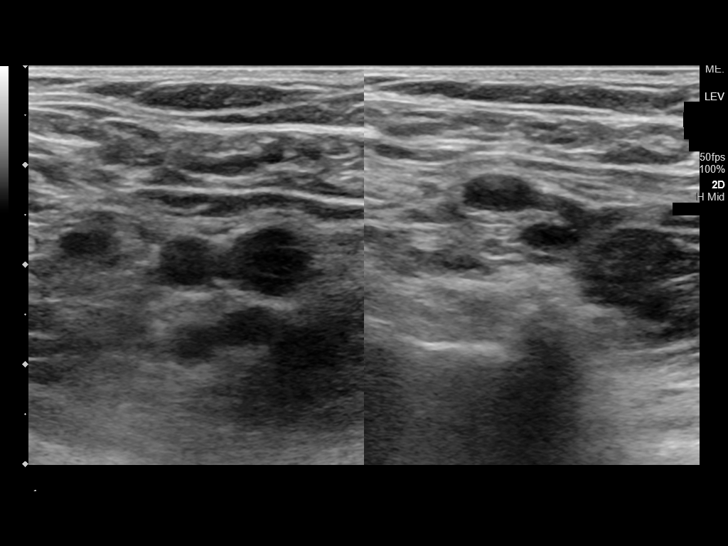
[im 15/35]
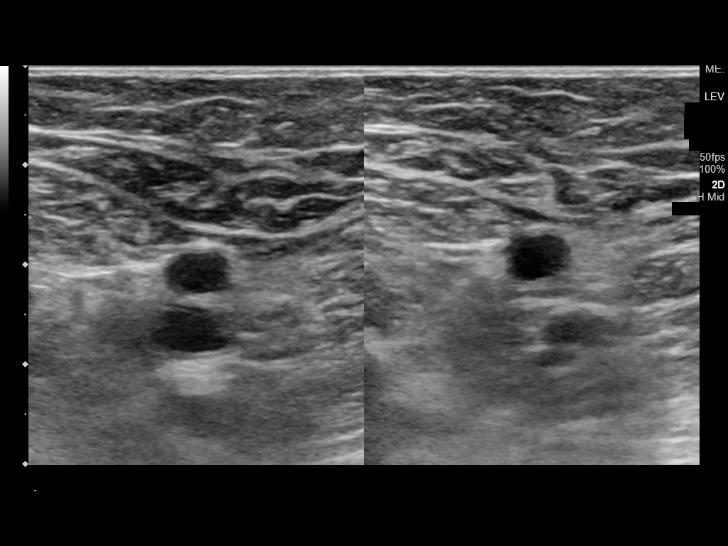
[im 18/35]
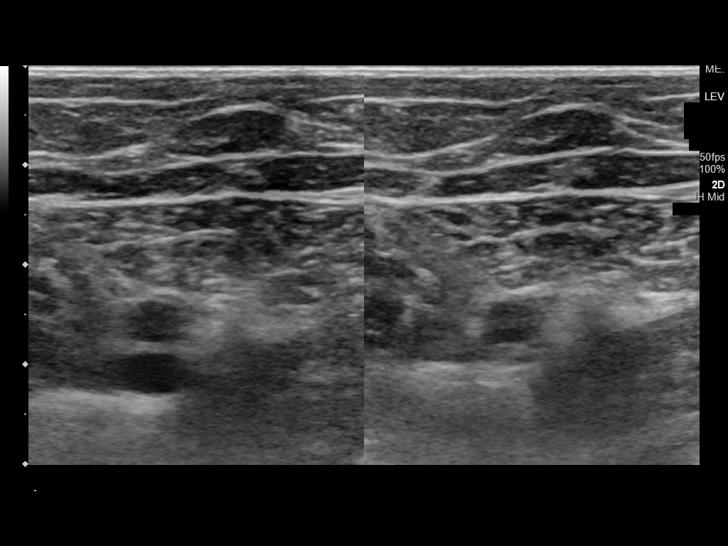
[im 20/35]
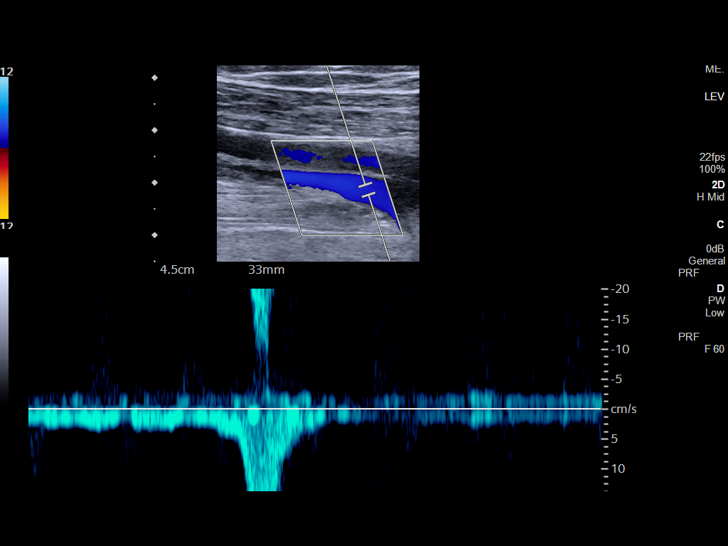
[im 23/35]
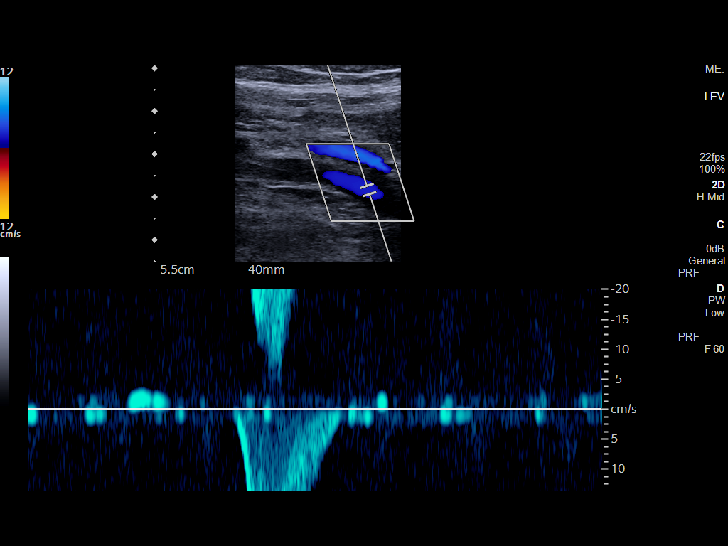
[im 26/35]
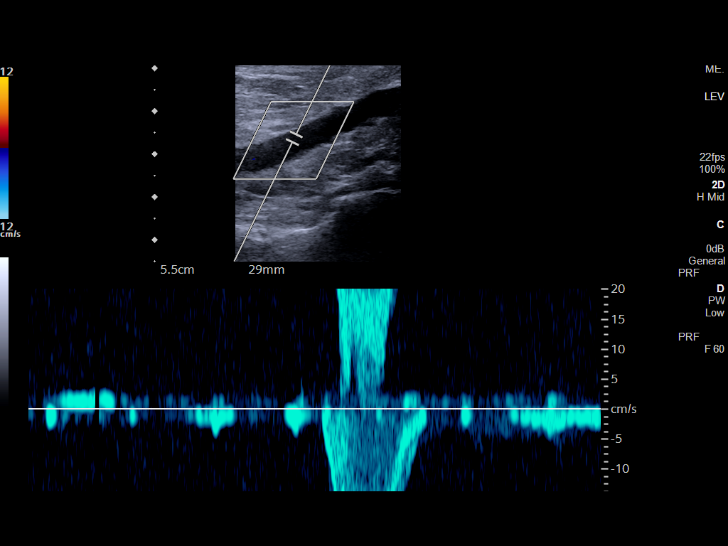
[im 29/35]
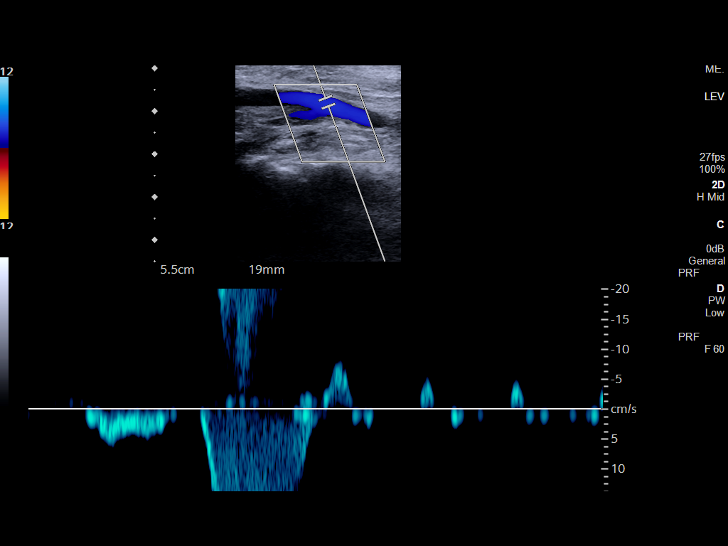
[im 32/35]
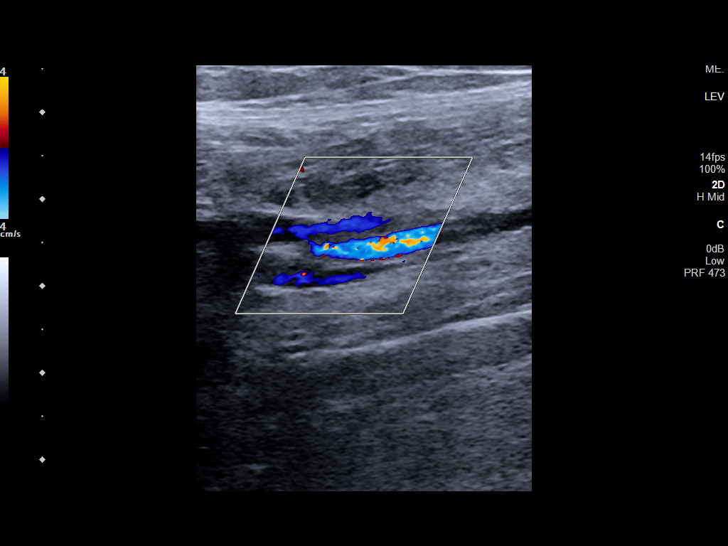
[im 35/35]
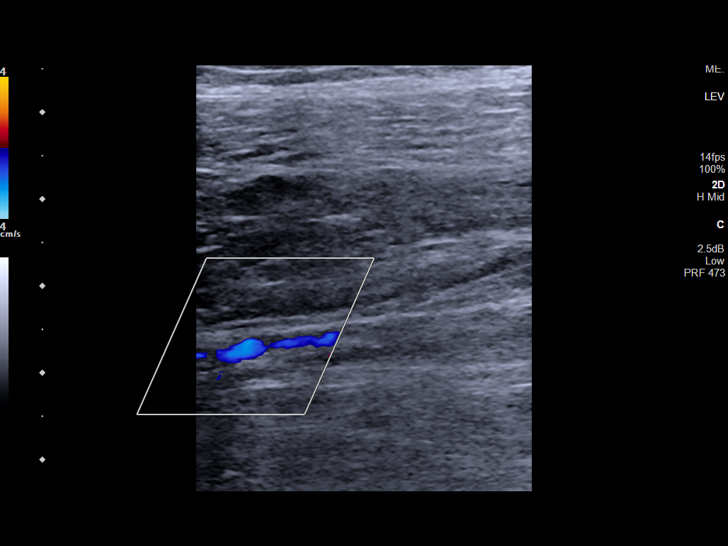

[13 of 24 positions shown; findings below may reference images not displayed]

FINDINGS: Contralateral Common Femoral Vein: Respiratory phasicity is normal
and symmetric with the symptomatic side. No evidence of thrombus.
Normal compressibility.

Common Femoral Vein: No evidence of thrombus. Normal
compressibility, respiratory phasicity and response to augmentation.

Saphenofemoral Junction: No evidence of thrombus. Normal
compressibility and flow on color Doppler imaging.

Profunda Femoral Vein: No evidence of thrombus. Normal
compressibility and flow on color Doppler imaging.

Femoral Vein: No evidence of thrombus. Normal compressibility,
respiratory phasicity and response to augmentation.

Popliteal Vein: No evidence of thrombus. Normal compressibility,
respiratory phasicity and response to augmentation.

Calf Veins: No evidence of thrombus. Normal compressibility and flow
on color Doppler imaging.

Other Findings:  None.
IMPRESSION: Negative for deep venous thrombosis in right lower extremity.
